# Patient Record
Sex: Female | Born: 1995 | Race: White | Hispanic: No | Marital: Married | Smoking: Never smoker
Health system: Southern US, Community
[De-identification: ages and names within clinical notes are randomized; demographics above are authoritative.]

## PROBLEM LIST (undated history)

## (undated) DIAGNOSIS — F988 Other specified behavioral and emotional disorders with onset usually occurring in childhood and adolescence: Secondary | ICD-10-CM

## (undated) DIAGNOSIS — O24419 Gestational diabetes mellitus in pregnancy, unspecified control: Secondary | ICD-10-CM

## (undated) DIAGNOSIS — Z8639 Personal history of other endocrine, nutritional and metabolic disease: Secondary | ICD-10-CM

## (undated) DIAGNOSIS — K219 Gastro-esophageal reflux disease without esophagitis: Secondary | ICD-10-CM

## (undated) HISTORY — DX: Personal history of other endocrine, nutritional and metabolic disease: Z86.39

## (undated) HISTORY — DX: Gestational diabetes mellitus in pregnancy, unspecified control: O24.419

## (undated) HISTORY — DX: Other specified behavioral and emotional disorders with onset usually occurring in childhood and adolescence: F98.8

## (undated) HISTORY — PX: WISDOM TOOTH EXTRACTION: SHX21

---
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
## 2013-05-30 ENCOUNTER — Other Ambulatory Visit: Payer: Self-pay | Admitting: Emergency Medicine

## 2013-05-30 MED ORDER — ONDANSETRON HCL 4 MG PO TABS: 4.0000 mg | ORAL_TABLET | Freq: Three times a day (TID) | ORAL | Status: DC | PRN

## 2013-06-24 ENCOUNTER — Other Ambulatory Visit: Payer: Self-pay | Admitting: Internal Medicine

## 2013-06-24 DIAGNOSIS — A088 Other specified intestinal infections: Secondary | ICD-10-CM

## 2013-06-24 MED ORDER — ONDANSETRON 8 MG PO TBDP: ORAL_TABLET | ORAL | Status: DC

## 2013-06-24 MED ORDER — HYOSCYAMINE SULFATE 0.125 MG SL SUBL: SUBLINGUAL_TABLET | SUBLINGUAL | Status: DC

## 2013-07-09 ENCOUNTER — Other Ambulatory Visit: Payer: Self-pay | Admitting: Physician Assistant

## 2013-07-09 MED ORDER — DOXYCYCLINE HYCLATE 100 MG PO TABS: 100.0000 mg | ORAL_TABLET | Freq: Two times a day (BID) | ORAL | Status: DC

## 2013-07-13 ENCOUNTER — Other Ambulatory Visit: Payer: Self-pay | Admitting: Emergency Medicine

## 2013-07-13 MED ORDER — FLUCONAZOLE 150 MG PO TABS: 150.0000 mg | ORAL_TABLET | Freq: Every day | ORAL | Status: DC

## 2013-10-24 ENCOUNTER — Ambulatory Visit: Payer: BC Managed Care – PPO

## 2013-10-27 DIAGNOSIS — Z8639 Personal history of other endocrine, nutritional and metabolic disease: Secondary | ICD-10-CM | POA: Insufficient documentation

## 2013-10-29 ENCOUNTER — Other Ambulatory Visit: Payer: Self-pay | Admitting: Emergency Medicine

## 2013-10-29 ENCOUNTER — Encounter: Payer: Self-pay | Admitting: Emergency Medicine

## 2013-10-29 ENCOUNTER — Ambulatory Visit (INDEPENDENT_AMBULATORY_CARE_PROVIDER_SITE_OTHER): Payer: BC Managed Care – PPO | Admitting: Emergency Medicine

## 2013-10-29 VITALS — BP 118/70 | HR 86 | Temp 98.6°F | Resp 18 | Ht 63.25 in | Wt 147.0 lb

## 2013-10-29 DIAGNOSIS — R5381 Other malaise: Secondary | ICD-10-CM

## 2013-10-29 DIAGNOSIS — R109 Unspecified abdominal pain: Secondary | ICD-10-CM

## 2013-10-29 DIAGNOSIS — Z119 Encounter for screening for infectious and parasitic diseases, unspecified: Secondary | ICD-10-CM

## 2013-10-29 DIAGNOSIS — R5383 Other fatigue: Secondary | ICD-10-CM

## 2013-10-29 DIAGNOSIS — K5900 Constipation, unspecified: Secondary | ICD-10-CM

## 2013-10-29 DIAGNOSIS — R350 Frequency of micturition: Secondary | ICD-10-CM

## 2013-10-29 DIAGNOSIS — E559 Vitamin D deficiency, unspecified: Secondary | ICD-10-CM

## 2013-10-29 LAB — CBC WITH DIFFERENTIAL/PLATELET
Basophils Absolute: 0 10*3/uL (ref 0.0–0.1)
Basophils Relative: 0 % (ref 0–1)
Eosinophils Absolute: 0.1 10*3/uL (ref 0.0–0.7)
Eosinophils Relative: 1 % (ref 0–5)
HEMATOCRIT: 39.6 % (ref 36.0–46.0)
HEMOGLOBIN: 13.9 g/dL (ref 12.0–15.0)
LYMPHS ABS: 3 10*3/uL (ref 0.7–4.0)
LYMPHS PCT: 31 % (ref 12–46)
MCH: 30.7 pg (ref 26.0–34.0)
MCHC: 35.1 g/dL (ref 30.0–36.0)
MCV: 87.4 fL (ref 78.0–100.0)
MONO ABS: 0.7 10*3/uL (ref 0.1–1.0)
MONOS PCT: 7 % (ref 3–12)
NEUTROS ABS: 5.9 10*3/uL (ref 1.7–7.7)
NEUTROS PCT: 61 % (ref 43–77)
Platelets: 221 10*3/uL (ref 150–400)
RBC: 4.53 MIL/uL (ref 3.87–5.11)
RDW: 13.1 % (ref 11.5–15.5)
WBC: 9.7 10*3/uL (ref 4.0–10.5)

## 2013-10-29 MED ORDER — NORETHINDRONE 0.35 MG PO TABS: 1.0000 | ORAL_TABLET | Freq: Every day | ORAL | Status: DC

## 2013-10-29 NOTE — Progress Notes (Signed)
   Subjective:    Patient ID: Kelsey Wheeler, female    DOB: 01/06/1996, 18 y.o.   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 She is doing well overall except mild fatigue and mild lower abdomen pain. She has had low abdomen pain x 2 days. She notes cycles are normal. She does have mild constipation on /off. She did have BM with Restora and stool softener yesterday. She has mild increased urine frequency/ dysuria since starting new job and holding urine for long periods of time. She denies chance of pregnancy.     Medication List       This list is accurate as of: 10/29/13  4:09 PM.  Always use your most recent med list.               CALCIUM 600 PO  Take 1,200 mg by mouth daily.     Vitamin D-3 5000 UNITS Tabs  Take 30,000 Units by mouth daily.          Review of Systems  Constitutional: Positive for fatigue.  Gastrointestinal: Positive for abdominal pain and constipation.  Genitourinary: Positive for dysuria and frequency.  All other systems reviewed and are negative.  BP 118/70  Pulse 86  Temp(Src) 98.6 F (37 C) (Temporal)  Resp 18  Ht 5' 3.25" (1.607 m)  Wt 147 lb (66.679 kg)  BMI 25.82 kg/m2  LMP 10/06/2013     Objective:   Physical Exam  Nursing note and vitals reviewed. Constitutional: She is oriented to person, place, and time. She appears well-developed and well-nourished. No distress.  HENT:  Head: Normocephalic and atraumatic.  Right Ear: External ear normal.  Left Ear: External ear normal.  Nose: Nose normal.  Mouth/Throat: Oropharynx is clear and moist.  Eyes: Conjunctivae and EOM are normal. Pupils are equal, round, and reactive to light.  Neck: Normal range of motion. Neck supple. No JVD present. No thyromegaly present.  Cardiovascular: Normal rate, regular rhythm, normal heart sounds and intact distal pulses.   Pulmonary/Chest: Effort normal and breath sounds normal.  Abdominal: Soft. Bowel sounds are  normal. She exhibits no distension and no mass. There is tenderness. There is no rebound and no guarding.  Mild diffuse  Genitourinary:  Declines until insurance coverage improves  Musculoskeletal: Normal range of motion. She exhibits no edema and no tenderness.  Lymphadenopathy:    She has no cervical adenopathy.  Neurological: She is alert and oriented to person, place, and time. She has normal reflexes. No cranial nerve deficit.  Skin: Skin is warm and dry. No rash noted. No erythema. No pallor.  Psychiatric: She has a normal mood and affect. Her behavior is normal. Judgment and thought content normal.          Assessment & Plan:  1. Fatigue- check labs, increase activity and H2O. Requests minimal labs with insurance issues. W/c if symptoms increase  2. ? Constipation vs UTI vs abdomen pain with cyst?- Increase fiber/ water intake, decrease caffeine, increase activity level, can add Miralax or Metamucil QD until BM soft and probiotic QD. Call with results with restarting OCP or if increased symptoms. Declines labs.   3. School CPE- WNL, check Rubella titer per school protocol, vaccines reviewed and up to date. PPD NEG given at her new job.

## 2013-11-01 LAB — RUBELLA ANTIBODY, IGM: RUBELLA: 0.36 (ref <0.90)

## 2013-11-03 LAB — RUBELLA SCREEN: RUBELLA: 3.28 {index} — AB (ref <0.90)

## 2013-12-07 ENCOUNTER — Other Ambulatory Visit: Payer: Self-pay | Admitting: Internal Medicine

## 2013-12-07 MED ORDER — IVERMECTIN 3 MG PO TABS: 9.0000 mg | ORAL_TABLET | Freq: Once | ORAL | Status: AC

## 2013-12-19 ENCOUNTER — Ambulatory Visit: Payer: Self-pay | Admitting: Emergency Medicine

## 2013-12-19 ENCOUNTER — Ambulatory Visit (INDEPENDENT_AMBULATORY_CARE_PROVIDER_SITE_OTHER): Payer: BC Managed Care – PPO | Admitting: Emergency Medicine

## 2013-12-19 ENCOUNTER — Encounter: Payer: Self-pay | Admitting: Emergency Medicine

## 2013-12-19 VITALS — BP 114/64 | HR 112 | Temp 98.0°F | Resp 18 | Ht 63.5 in | Wt 146.0 lb

## 2013-12-19 DIAGNOSIS — J309 Allergic rhinitis, unspecified: Secondary | ICD-10-CM

## 2013-12-19 DIAGNOSIS — L03317 Cellulitis of buttock: Principal | ICD-10-CM

## 2013-12-19 DIAGNOSIS — L0231 Cutaneous abscess of buttock: Secondary | ICD-10-CM

## 2013-12-19 MED ORDER — DOXYCYCLINE HYCLATE 100 MG PO TABS
100.0000 mg | ORAL_TABLET | Freq: Two times a day (BID) | ORAL | Status: DC
Start: 2013-12-19 — End: 2014-02-12

## 2013-12-19 NOTE — Patient Instructions (Signed)
Abscess Warm epsom salt soaks An abscess (boil or furuncle) is an infected area on or under the skin. This area is filled with yellowish-white fluid (pus) and other material (debris). HOME CARE   Only take medicines as told by your doctor.  If you were given antibiotic medicine, take it as directed. Finish the medicine even if you start to feel better.  If gauze is used, follow your doctor's directions for changing the gauze.  To avoid spreading the infection:  Keep your abscess covered with a bandage.  Wash your hands well.  Do not share personal care items, towels, or whirlpools with others.  Avoid skin contact with others.  Keep your skin and clothes clean around the abscess.  Keep all doctor visits as told. GET HELP RIGHT AWAY IF:   You have more pain, puffiness (swelling), or redness in the wound site.  You have more fluid or blood coming from the wound site.  You have muscle aches, chills, or you feel sick.  You have a fever. MAKE SURE YOU:   Understand these instructions.  Will watch your condition.  Will get help right away if you are not doing well or get worse. Document Released: 11/17/2007 Document Revised: 11/30/2011 Document Reviewed: 08/13/2011 [facility] Patient Information 2015 [place], [place]. This information is not intended to replace advice given to you by your health care provider. Make sure you discuss any questions you have with your health care provider.  Sore Throat Warm salt water gargles daily. 1 tsp liquid benadryl + 1 tsp liquid Maalox, MIX/ GARGLE/ SPIT as needed for pain  A sore throat is a painful, burning, sore, or scratchy feeling of the throat. There may be pain or tenderness when swallowing or talking. You may have other symptoms with a sore throat. These include coughing, sneezing, fever, or a swollen neck. A sore throat is often the first sign of another sickness. These sicknesses may include a cold, flu, strep throat, or an infection  called mono. Most sore throats go away without medical treatment.  HOME CARE   Only take medicine as told by your doctor.  Drink enough fluids to keep your pee (urine) clear or pale yellow.  Rest as needed.  Try using throat sprays, lozenges, or suck on hard candy (if older than 4 years or as told).  Sip warm liquids, such as broth, herbal tea, or warm water with honey. Try sucking on frozen ice pops or drinking cold liquids.  Rinse the mouth (gargle) with salt water. Mix 1 teaspoon salt with 8 ounces of water.  Do not smoke. Avoid being around others when they are smoking.  Put a humidifier in your bedroom at night to moisten the air. You can also turn on a hot shower and sit in the bathroom for 5-10 minutes. Be sure the bathroom door is closed. GET HELP RIGHT AWAY IF:   You have trouble breathing.  You cannot swallow fluids, soft foods, or your spit (saliva).  You have more puffiness (swelling) in the throat.  Your sore throat does not get better in 7 days.  You feel sick to your stomach (nauseous) and throw up (vomit).  You have a fever or lasting symptoms for more than 2-3 days.  You have a fever and your symptoms suddenly get worse. MAKE SURE YOU:   Understand these instructions.  Will watch your condition.  Will get help right away if you are not doing well or get worse. Document Released: 03/09/2008 Document Revised: 02/23/2012  Document Reviewed: 02/06/2012 [facility] Patient Information 2015 [place], [place]. This information is not intended to replace advice given to you by your health care provider. Make sure you discuss any questions you have with your health care provider.

## 2013-12-19 NOTE — Progress Notes (Signed)
   Subjective:    Patient ID: Kelsey DickerRachel Ivey, female    DOB: 01/17/1996, 18 y.o.   MRN: [id number]  HPI Comments: 18 yo WF with ? Boil on bottom x 2 days with increased pain and redness x 1 day. She denies previous history of boils. She notes she had prolonged sitting over the weekend and was hot/ sweaty/ dirty.  She has had ST x 1 day and was exposed to several other sick people this weekend.  Sore Throat      Medication List       This list is accurate as of: 12/19/13  1:35 PM.  Always use your most recent med list.               CALCIUM 600 PO  Take 1,200 mg by mouth daily.     norethindrone 0.35 MG tablet  Commonly known as:  ORTHO MICRONOR  Take 1 tablet (0.35 mg total) by mouth daily.     Vitamin D-3 5000 UNITS Tabs  Take 30,000 Units by mouth daily.       No Known Allergies  Past Medical History  Diagnosis Date  . Hx of thyroiditis       Review of Systems  HENT: Positive for sore throat.   Skin: Positive for color change and wound.  All other systems reviewed and are negative.  BP 114/64  Pulse 112  Temp(Src) 98 F (36.7 C) (Temporal)  Resp 18  Ht 5' 3.5" (1.613 m)  Wt 146 lb (66.225 kg)  BMI 25.45 kg/m2  LMP 12/10/2013     Objective:   Physical Exam  Nursing note and vitals reviewed. Constitutional: She is oriented to person, place, and time. She appears well-developed and well-nourished.  HENT:  Head: Normocephalic and atraumatic.  Right Ear: External ear normal.  Left Ear: External ear normal.  Nose: Nose normal.  Mouth/Throat: Oropharynx is clear and moist.  Left tonsil with stone no erythema, Cloudy TM's bilaterally   Eyes: Conjunctivae and EOM are normal.  Neck: Normal range of motion.  Cardiovascular: Normal rate, regular rhythm, normal heart sounds and intact distal pulses.   Pulmonary/Chest: Effort normal and breath sounds normal.  Musculoskeletal: Normal range of motion.  Lymphadenopathy:    She has no cervical adenopathy.    Neurological: She is alert and oriented to person, place, and time.  Skin: Skin is warm and dry.     Psychiatric: She has a normal mood and affect. Judgment normal.          Assessment & Plan:  1. Abscess- DOxy 100 mg AD, Epsom salt soaks, Hygiene discussed, f/u with results  2. Allergic rhinitis- Nasonex NS AD sx #1, increase H2o, allergy hygiene explained.

## 2013-12-21 ENCOUNTER — Encounter: Payer: Self-pay | Admitting: Emergency Medicine

## 2013-12-21 ENCOUNTER — Ambulatory Visit (INDEPENDENT_AMBULATORY_CARE_PROVIDER_SITE_OTHER): Payer: BC Managed Care – PPO | Admitting: Emergency Medicine

## 2013-12-21 VITALS — BP 104/78 | HR 72 | Temp 98.0°F | Resp 18

## 2013-12-21 DIAGNOSIS — L0231 Cutaneous abscess of buttock: Secondary | ICD-10-CM

## 2013-12-21 DIAGNOSIS — L03317 Cellulitis of buttock: Principal | ICD-10-CM

## 2013-12-21 NOTE — Progress Notes (Signed)
   Subjective:    Patient ID: Kelsey Wheeler, female    DOB: 04/13/1996, 18 y.o.   MRN: [id number]  HPI Comments: 18 yo WF for recheck of left buttock abscess. She has been on doxycycline AD and notes new head formation on area of concern. She has been soaking in warm water.     Review of Systems  Skin: Positive for wound.  All other systems reviewed and are negative.  BP 104/78  Pulse 72  Temp(Src) 98 F (36.7 C) (Temporal)  Resp 18  LMP 12/10/2013     Objective:   Physical Exam  Nursing note and vitals reviewed. Constitutional: She appears well-developed and well-nourished.  Musculoskeletal: Normal range of motion.  Neurological: She is alert.  Skin: Skin is warm and dry. There is erythema.     Psychiatric: Judgment normal.          Assessment & Plan:  Abscess left buttock- Verbal permission obtained. Area prepped with alcholol, #10 blade used to make small incision into area with blood/ yellow brown exudate expressed and sent for culture. Wound hygiene explained. Continue DOXY AD. Call with any concerns

## 2013-12-24 LAB — WOUND CULTURE
GRAM STAIN: NONE SEEN
GRAM STAIN: NONE SEEN
GRAM STAIN: NONE SEEN

## 2014-02-12 ENCOUNTER — Ambulatory Visit (INDEPENDENT_AMBULATORY_CARE_PROVIDER_SITE_OTHER): Payer: BC Managed Care – PPO | Admitting: Physician Assistant

## 2014-02-12 ENCOUNTER — Encounter: Payer: Self-pay | Admitting: Physician Assistant

## 2014-02-12 VITALS — BP 102/62 | HR 80 | Temp 97.1°F | Resp 16 | Ht 63.5 in | Wt 145.0 lb

## 2014-02-12 DIAGNOSIS — F411 Generalized anxiety disorder: Secondary | ICD-10-CM

## 2014-02-12 DIAGNOSIS — F988 Other specified behavioral and emotional disorders with onset usually occurring in childhood and adolescence: Secondary | ICD-10-CM

## 2014-02-12 MED ORDER — BUPROPION HCL ER (XL) 150 MG PO TB24: 150.0000 mg | ORAL_TABLET | ORAL | Status: DC

## 2014-02-12 NOTE — Progress Notes (Signed)
   Subjective:    Patient ID: Kelsey Wheeler, female    DOB: Jun 17, 1995, 18 y.o.   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 She states she has had increased worsening concentration in school, she reports a history since the 9th grade of getting trouble in school for talking, difficulty completing tasks.    Review of Systems  Constitutional: Negative.   HENT: Negative.   Respiratory: Negative.   Cardiovascular: Negative.   Gastrointestinal: Negative.   Genitourinary: Negative.   Musculoskeletal: Negative.   Neurological: Negative.   Psychiatric/Behavioral: Positive for decreased concentration. Negative for suicidal ideas, hallucinations, behavioral problems, confusion, sleep disturbance, self-injury, dysphoric mood and agitation. The patient is nervous/anxious. The patient is not hyperactive.        Objective:   Physical Exam  Constitutional: She is oriented to person, place, and time. She appears well-developed and well-nourished.  HENT:  Head: Normocephalic and atraumatic.  Eyes: Conjunctivae are normal. Pupils are equal, round, and reactive to light.  Neck: Normal range of motion. Neck supple.  Cardiovascular: Normal rate and regular rhythm.   Pulmonary/Chest: Effort normal and breath sounds normal.  Abdominal: Soft. Bowel sounds are normal.  Musculoskeletal: Normal range of motion.  Neurological: She is alert and oriented to person, place, and time.  Skin: Skin is warm and dry.       Assessment & Plan:  ADD- will try Wellbutrin 150XL for 1 month for anxiety/ADD , if it does not help we will try Adderall  BID.  Schedule a physical

## 2014-02-12 NOTE — Patient Instructions (Signed)

## 2014-02-26 ENCOUNTER — Encounter: Payer: Self-pay | Admitting: Emergency Medicine

## 2014-02-26 ENCOUNTER — Ambulatory Visit (INDEPENDENT_AMBULATORY_CARE_PROVIDER_SITE_OTHER): Payer: BC Managed Care – PPO | Admitting: Emergency Medicine

## 2014-02-26 VITALS — BP 112/68 | HR 92 | Temp 98.4°F | Resp 18 | Ht 64.0 in | Wt 149.0 lb

## 2014-02-26 DIAGNOSIS — Z0000 Encounter for general adult medical examination without abnormal findings: Secondary | ICD-10-CM

## 2014-02-26 DIAGNOSIS — F988 Other specified behavioral and emotional disorders with onset usually occurring in childhood and adolescence: Secondary | ICD-10-CM

## 2014-02-26 LAB — CBC WITH DIFFERENTIAL/PLATELET
Basophils Absolute: 0.1 10*3/uL (ref 0.0–0.1)
Basophils Relative: 1 % (ref 0–1)
Eosinophils Absolute: 0.1 10*3/uL (ref 0.0–0.7)
Eosinophils Relative: 1 % (ref 0–5)
HCT: 42 % (ref 36.0–46.0)
HEMOGLOBIN: 14.9 g/dL (ref 12.0–15.0)
LYMPHS ABS: 2.8 10*3/uL (ref 0.7–4.0)
LYMPHS PCT: 25 % (ref 12–46)
MCH: 31.3 pg (ref 26.0–34.0)
MCHC: 35.5 g/dL (ref 30.0–36.0)
MCV: 88.2 fL (ref 78.0–100.0)
MONOS PCT: 6 % (ref 3–12)
Monocytes Absolute: 0.7 10*3/uL (ref 0.1–1.0)
NEUTROS PCT: 67 % (ref 43–77)
Neutro Abs: 7.6 10*3/uL (ref 1.7–7.7)
PLATELETS: 238 10*3/uL (ref 150–400)
RBC: 4.76 MIL/uL (ref 3.87–5.11)
RDW: 13.1 % (ref 11.5–15.5)
WBC: 11.3 10*3/uL — AB (ref 4.0–10.5)

## 2014-02-26 MED ORDER — AMPHETAMINE-DEXTROAMPHETAMINE 10 MG PO TABS: 10.0000 mg | ORAL_TABLET | Freq: Two times a day (BID) | ORAL | Status: DC

## 2014-02-26 NOTE — Progress Notes (Signed)
Subjective:    Patient ID: Kelsey Wheeler, female    DOB: 08-15-1995, 18 y.o.   MRN: [id number]  HPI Comments: 18 yo WF CPE. She notes increased difficulty with school and has tried Wellbutrin with out relief of symptoms. She also noted increased fatigue and dizziness with Wellbutrin. She notes she has to read homework multiple times and still has difficulty with comprehension. She has failed Psych 2 times in the last year. She has had difficulty keeping focused and finishing tasks.  She notes her cycles are more regular. She has been eating healthy. She is not working out routinely. She does want to consider starting birth control in the future with plans to get married in the next 2 years. She has tried multiple OCP in past but discontinued with mood changes or weight gain.    WBC      9.7   10/29/2013 HGB     13.9   10/29/2013 HCT     39.6   10/29/2013 PLT      221   10/29/2013    Current Outpatient Prescriptions on File Prior to Visit  Medication Sig Dispense Refill  . Calcium Carbonate (CALCIUM 600 PO) Take 1,200 mg by mouth daily.      . Cholecalciferol (VITAMIN D-3) 5000 UNITS TABS Take 30,000 Units by mouth daily.      . norethindrone (ORTHO MICRONOR) 0.35 MG tablet Take 1 tablet (0.35 mg total) by mouth daily.  1 Package  11   No current facility-administered medications on file prior to visit.   No Known Allergies Past Medical History  Diagnosis Date  . Hx of thyroiditis    No past surgical history on file.  History  Substance Use Topics  . Smoking status: Never Smoker   . Smokeless tobacco: Not on file  . Alcohol Use: Not on file   Family History  Problem Relation Age of Onset  . Hyperlipidemia Mother   . Hyperlipidemia Father   . Hyperlipidemia Maternal Grandmother   . Hypertension Maternal Grandmother   . Cancer Maternal Grandfather     lung  . Cancer Paternal Grandfather     lung   MAINTENANCE: Pap/ Pelvic:05/2008 with U/S WNL EYE: Dentist:q 6  month  IMMUNIZATIONS: Tdap:2013 Influenza:2014  Patient Care Team: Lucky Cowboy, MD as PCP - General (Internal Medicine)   Review of Systems  Constitutional: Negative for fatigue.  Respiratory: Negative for chest tightness.   Cardiovascular: Negative for chest pain.  Genitourinary: Negative for pelvic pain.  Psychiatric/Behavioral: Positive for decreased concentration. Negative for suicidal ideas, behavioral problems and sleep disturbance.  All other systems reviewed and are negative.  BP 112/68  Pulse 92  Temp(Src) 98.4 F (36.9 C) (Temporal)  Resp 18  Ht  (1.626 m)  Wt 149 lb (67.586 kg)  BMI 25.56 kg/m2  LMP 02/13/2014     Objective:   Physical Exam  Nursing note and vitals reviewed. Constitutional: She is oriented to person, place, and time. She appears well-developed and well-nourished. No distress.  HENT:  Head: Normocephalic and atraumatic.  Right Ear: External ear normal.  Left Ear: External ear normal.  Nose: Nose normal.  Mouth/Throat: Oropharynx is clear and moist.  1 + tonsils no erythema  Eyes: Conjunctivae and EOM are normal. Pupils are equal, round, and reactive to light. Right eye exhibits no discharge. Left eye exhibits no discharge. No scleral icterus.  Neck: Normal range of motion. Neck supple. No JVD present. No tracheal deviation present. No thyromegaly present.  Cardiovascular: Normal rate, regular rhythm, normal heart sounds and intact distal pulses.   Pulmonary/Chest: Effort normal and breath sounds normal.  Abdominal: Soft. Bowel sounds are normal. She exhibits no distension and no mass. There is no tenderness. There is no rebound and no guarding.  Genitourinary:  DEF 2016  Musculoskeletal: Normal range of motion. She exhibits no edema and no tenderness.  Lymphadenopathy:    She has no cervical adenopathy.  Neurological: She is alert and oriented to person, place, and time. She has normal reflexes. No cranial nerve deficit. She  exhibits normal muscle tone. Coordination normal.  Skin: Skin is warm and dry. No rash noted. No erythema. No pallor.  Psychiatric: She has a normal mood and affect. Her behavior is normal. Judgment and thought content normal.        Assessment & Plan:  1. CPE- Update screening labs/ History/ Immunizations/ Testing as needed. Advised healthy diet, QD exercise, increase H20 and continue RX/ Vitamins AD.  2. ADD- Adderall 10 mg advised try 1 daily x 1 week then if no change increase to 1 in early a.m. And 1/2 to one at lunch and call with results. Advised needs to try to establish good routines with school work/ sleep hygiene/ exercise

## 2014-02-26 NOTE — Patient Instructions (Signed)

## 2014-02-27 LAB — HEPATIC FUNCTION PANEL
ALBUMIN: 4.4 g/dL (ref 3.5–5.2)
ALT: 18 U/L (ref 0–35)
AST: 15 U/L (ref 0–37)
Alkaline Phosphatase: 66 U/L (ref 39–117)
BILIRUBIN INDIRECT: 0.2 mg/dL (ref 0.2–1.2)
Bilirubin, Direct: 0.1 mg/dL (ref 0.0–0.3)
TOTAL PROTEIN: 7.2 g/dL (ref 6.0–8.3)
Total Bilirubin: 0.3 mg/dL (ref 0.2–1.2)

## 2014-02-27 LAB — LIPID PANEL
Cholesterol: 132 mg/dL (ref 0–200)
HDL: 59 mg/dL (ref >39)
LDL CALC: 50 mg/dL (ref 0–99)
TRIGLYCERIDES: 115 mg/dL (ref <150)
Total CHOL/HDL Ratio: 2.2 Ratio
VLDL: 23 mg/dL (ref 0–40)

## 2014-02-27 LAB — URINALYSIS, ROUTINE W REFLEX MICROSCOPIC
Bilirubin Urine: NEGATIVE
Glucose, UA: NEGATIVE mg/dL
HGB URINE DIPSTICK: NEGATIVE
Ketones, ur: NEGATIVE mg/dL
LEUKOCYTES UA: NEGATIVE
NITRITE: NEGATIVE
PROTEIN: NEGATIVE mg/dL
Specific Gravity, Urine: 1.007 (ref 1.005–1.030)
UROBILINOGEN UA: 0.2 mg/dL (ref 0.0–1.0)
pH: 6 (ref 5.0–8.0)

## 2014-02-27 LAB — VITAMIN B12: Vitamin B-12: 347 pg/mL (ref 211–911)

## 2014-02-27 LAB — FOLATE RBC: RBC Folate: 496 ng/mL (ref >280)

## 2014-02-27 LAB — TSH: TSH: 1.077 u[IU]/mL (ref 0.350–4.500)

## 2014-02-27 LAB — BASIC METABOLIC PANEL WITH GFR
BUN: 13 mg/dL (ref 6–23)
CALCIUM: 9.4 mg/dL (ref 8.4–10.5)
CHLORIDE: 101 meq/L (ref 96–112)
CO2: 29 meq/L (ref 19–32)
CREATININE: 0.97 mg/dL (ref 0.50–1.10)
GFR, Est African American: >89 mL/min
GFR, Est Non African American: 84 mL/min
GLUCOSE: 73 mg/dL (ref 70–99)
Potassium: 4.2 mEq/L (ref 3.5–5.3)
Sodium: 139 mEq/L (ref 135–145)

## 2014-02-27 LAB — IRON AND TIBC
%SAT: 29 % (ref 20–55)
Iron: 81 ug/dL (ref 42–145)
TIBC: 281 ug/dL (ref 250–470)
UIBC: 200 ug/dL (ref 125–400)

## 2014-02-27 LAB — VITAMIN D 25 HYDROXY (VIT D DEFICIENCY, FRACTURES): VIT D 25 HYDROXY: 35 ng/mL (ref 30–89)

## 2014-02-27 LAB — INSULIN, FASTING: INSULIN FASTING, SERUM: 16.1 u[IU]/mL (ref 2.0–19.6)

## 2014-02-27 LAB — HEMOGLOBIN A1C
HEMOGLOBIN A1C: 5.1 % (ref <5.7)
Mean Plasma Glucose: 100 mg/dL (ref <117)

## 2014-02-27 LAB — MAGNESIUM: MAGNESIUM: 2.1 mg/dL (ref 1.5–2.5)

## 2014-03-03 ENCOUNTER — Encounter: Payer: Self-pay | Admitting: Emergency Medicine

## 2014-03-03 DIAGNOSIS — F988 Other specified behavioral and emotional disorders with onset usually occurring in childhood and adolescence: Secondary | ICD-10-CM

## 2014-03-03 HISTORY — DX: Other specified behavioral and emotional disorders with onset usually occurring in childhood and adolescence: F98.8

## 2014-03-12 ENCOUNTER — Ambulatory Visit (INDEPENDENT_AMBULATORY_CARE_PROVIDER_SITE_OTHER): Payer: BC Managed Care – PPO | Admitting: *Deleted

## 2014-03-12 DIAGNOSIS — Z23 Encounter for immunization: Secondary | ICD-10-CM

## 2014-05-06 ENCOUNTER — Telehealth: Payer: Self-pay | Admitting: Physician Assistant

## 2014-05-06 DIAGNOSIS — F988 Other specified behavioral and emotional disorders with onset usually occurring in childhood and adolescence: Secondary | ICD-10-CM

## 2014-05-06 MED ORDER — AMPHETAMINE-DEXTROAMPHETAMINE 10 MG PO TABS: 10.0000 mg | ORAL_TABLET | Freq: Two times a day (BID) | ORAL | Status: DC

## 2014-05-06 NOTE — Telephone Encounter (Signed)
Patient's Mother Kelsey Wheeler works here and she is going to give pt prescription.

## 2014-05-06 NOTE — Telephone Encounter (Signed)
Patient needs refill of Adderall- last filled 02/26/14.  She only takes 1 and 1/2 tablets per day.  Last visit 02/26/14.  Next visit 02/27/15 for physical.  Cullen Lahaie, Lise AuerJennifer L, PA-C 8:11 AM [facility]

## 2014-09-13 ENCOUNTER — Encounter (INDEPENDENT_AMBULATORY_CARE_PROVIDER_SITE_OTHER): Payer: Self-pay

## 2014-09-13 ENCOUNTER — Other Ambulatory Visit: Payer: Self-pay | Admitting: Internal Medicine

## 2014-09-13 ENCOUNTER — Other Ambulatory Visit: Payer: Commercial Indemnity

## 2014-09-13 DIAGNOSIS — Z8614 Personal history of Methicillin resistant Staphylococcus aureus infection: Secondary | ICD-10-CM

## 2014-09-16 LAB — MRSA CULTURE

## 2014-09-19 ENCOUNTER — Encounter: Payer: Self-pay | Admitting: Emergency Medicine

## 2014-10-07 ENCOUNTER — Other Ambulatory Visit: Payer: Self-pay | Admitting: Internal Medicine

## 2014-10-07 DIAGNOSIS — F988 Other specified behavioral and emotional disorders with onset usually occurring in childhood and adolescence: Secondary | ICD-10-CM

## 2014-10-07 MED ORDER — AMPHETAMINE-DEXTROAMPHETAMINE 10 MG PO TABS: 10.0000 mg | ORAL_TABLET | Freq: Two times a day (BID) | ORAL | Status: DC

## 2014-10-23 ENCOUNTER — Encounter: Payer: Self-pay | Admitting: Emergency Medicine

## 2014-10-23 ENCOUNTER — Ambulatory Visit (INDEPENDENT_AMBULATORY_CARE_PROVIDER_SITE_OTHER): Payer: Commercial Indemnity | Admitting: Emergency Medicine

## 2014-10-23 VITALS — BP 100/62 | HR 74 | Temp 98.6°F | Resp 18 | Ht 64.0 in | Wt 146.0 lb

## 2014-10-23 DIAGNOSIS — N926 Irregular menstruation, unspecified: Secondary | ICD-10-CM

## 2014-10-23 DIAGNOSIS — F909 Attention-deficit hyperactivity disorder, unspecified type: Secondary | ICD-10-CM

## 2014-10-23 DIAGNOSIS — F988 Other specified behavioral and emotional disorders with onset usually occurring in childhood and adolescence: Secondary | ICD-10-CM

## 2014-10-23 MED ORDER — NORETHIN ACE-ETH ESTRAD-FE 1-20 MG-MCG(24) PO CHEW: CHEWABLE_TABLET | ORAL | Status: DC

## 2014-10-23 NOTE — Progress Notes (Signed)
Subjective:    Patient ID: Kelsey Wheeler, female    DOB: [birth year], 18 y.o.   MRN: [id number]  HPI Comments: 19 yo WF f/u for ADD and OCP concerns. She takes adderall when needed and denies any SE. She uses for focus with school.   She is still having irregular periods but more consistent than in the past. She had tried [place] which made her irritable/ gain weight. Nuva ring was not tolerated. She still has cramps with each cycle controlled with OTC Tylenol. LMP current. She wants to retry oral OCP.   She has not been taking Vitamins AD.    Lab Results      Component                Value               Date                      WBC                      11.3*               02/26/2014                HGB                      14.9                02/26/2014                HCT                      42.0                02/26/2014                PLT                      238                 02/26/2014                GLUCOSE                  73                  02/26/2014                CHOL                     132                 02/26/2014                TRIG                     115                 02/26/2014                HDL                      59                  02/26/2014  LDLCALC                  50                  02/26/2014                ALT                      18                  02/26/2014                AST                      15                  02/26/2014                NA                       139                 02/26/2014                K                        4.2                 02/26/2014                CL                       101                 02/26/2014                CREATININE               0.97                02/26/2014                BUN                      13                  02/26/2014                CO2                      29                  02/26/2014                TSH                      1.077               02/26/2014                HGBA1C                    5.1                 02/26/2014                Medication List  This list is accurate as of: 10/23/14  2:03 PM.  Always use your most recent med list.               amphetamine-dextroamphetamine 10 MG tablet  Commonly known as:  ADDERALL  Take 1 tablet (10 mg total) by mouth 2 (two) times daily with a meal.       No Known Allergies  Past Medical History  Diagnosis Date  . Hx of thyroiditis   . ADD (attention deficit disorder) 03/03/2014     Review of Systems  Constitutional: Negative for fatigue.  Respiratory: Negative for shortness of breath.   Gastrointestinal: Negative for abdominal pain.  Genitourinary: Positive for menstrual problem. Negative for pelvic pain.  All other systems reviewed and are negative.  BP 100/62 mmHg  Pulse 74  Temp(Src) 98.6 F (37 C) (Temporal)  Resp 18  Ht 5\' 4"  (1.626 m)  Wt 146 lb (66.225 kg)  BMI 25.05 kg/m2  LMP 10/23/2014     Objective:   Physical Exam  Constitutional: She is oriented to person, place, and time. She appears well-developed and well-nourished. No distress.  HENT:  Head: Normocephalic.  Eyes: Conjunctivae and EOM are normal.  Neck: Normal range of motion. Neck supple. No thyromegaly present.  Cardiovascular: Normal rate, regular rhythm, normal heart sounds and intact distal pulses.   Pulmonary/Chest: Effort normal and breath sounds normal.  Abdominal: Soft. Bowel sounds are normal. There is no tenderness.  Musculoskeletal: Normal range of motion.  Lymphadenopathy:    She has no cervical adenopathy.  Neurological: She is alert and oriented to person, place, and time. No cranial nerve deficit.  Skin: Skin is warm and dry. No rash noted.  Psychiatric: She has a normal mood and affect. Judgment normal.  Nursing note and vitals reviewed.       Assessment & Plan:  1. ADD- Controlled continue AD.   2. Abnormal menses- Trial of new OCP. Advised of risks of complications with PE/ DVT-  CLOTs and will go to ER with any concerns. Advised of OCP hygiene. Call with any concerns  3. VIT D and B 12 low normals- advised 5000 IU D and Super B OTC AD.

## 2014-11-15 ENCOUNTER — Ambulatory Visit (INDEPENDENT_AMBULATORY_CARE_PROVIDER_SITE_OTHER): Payer: Commercial Indemnity | Admitting: *Deleted

## 2014-11-15 DIAGNOSIS — Z111 Encounter for screening for respiratory tuberculosis: Secondary | ICD-10-CM

## 2014-11-18 LAB — TB SKIN TEST
INDURATION: 0 mm
TB Skin Test: NEGATIVE

## 2015-01-02 ENCOUNTER — Other Ambulatory Visit: Payer: Self-pay

## 2015-01-02 DIAGNOSIS — N926 Irregular menstruation, unspecified: Secondary | ICD-10-CM

## 2015-01-02 MED ORDER — NORETHIN ACE-ETH ESTRAD-FE 1-20 MG-MCG(24) PO CHEW: CHEWABLE_TABLET | ORAL | Status: DC

## 2015-02-07 ENCOUNTER — Encounter: Payer: Self-pay | Admitting: Internal Medicine

## 2015-02-07 ENCOUNTER — Ambulatory Visit (INDEPENDENT_AMBULATORY_CARE_PROVIDER_SITE_OTHER): Payer: Commercial Indemnity | Admitting: Internal Medicine

## 2015-02-07 VITALS — BP 126/82 | HR 76 | Temp 97.5°F | Resp 16 | Ht 64.0 in | Wt 152.8 lb

## 2015-02-07 DIAGNOSIS — Z3009 Encounter for other general counseling and advice on contraception: Secondary | ICD-10-CM

## 2015-02-07 MED ORDER — NORETHINDRONE 0.35 MG PO TABS: 1.0000 | ORAL_TABLET | Freq: Every day | ORAL | Status: DC

## 2015-02-07 NOTE — Patient Instructions (Signed)
Norethindrone tablets (contraception) What is this medicine? NORETHINDRONE (nor eth IN drone) is an oral contraceptive. The product contains a female hormone known as a progestin. It is used to prevent pregnancy. This medicine may be used for other purposes; ask your health care provider or pharmacist if you have questions. COMMON BRAND NAME(S): Camila, Deblitane 28-Day, Errin, Heather, Jencycla, Jolivette, Lyza, Nor-QD, Nora-BE, Norlyroc, Ortho Micronor, Sharobel 28-Day What should I tell my health care provider before I take this medicine? They need to know if you have any of these conditions: -blood vessel disease or blood clots -breast, cervical, or vaginal cancer -diabetes -heart disease -kidney disease -liver disease -mental depression -migraine -seizures -stroke -vaginal bleeding -an unusual or allergic reaction to norethindrone, other medicines, foods, dyes, or preservatives -pregnant or trying to get pregnant -breast-feeding How should I use this medicine? Take this medicine by mouth with a glass of water. You may take it with or without food. Follow the directions on the prescription label. Take this medicine at the same time each day and in the order directed on the package. Do not take your medicine more often than directed. Contact your pediatrician regarding the use of this medicine in children. Special care may be needed. This medicine has been used in female children who have started having menstrual periods. A patient package insert for the product will be given with each prescription and refill. Read this sheet carefully each time. The sheet may change frequently. Overdosage: If you think you have taken too much of this medicine contact a poison control center or emergency room at once. NOTE: This medicine is only for you. Do not share this medicine with others. What if I miss a dose? Try not to miss a dose. Every time you miss a dose or take a dose late your chance of  pregnancy increases. When 1 pill is missed (even if only 3 hours late), take the missed pill as soon as possible and continue taking a pill each day at the regular time (use a back up method of birth control for the next 48 hours). If more than 1 dose is missed, use an additional birth control method for the rest of your pill pack until menses occurs. Contact your health care professional if more than 1 dose has been missed. What may interact with this medicine? Do not take this medicine with any of the following medications: -amprenavir or fosamprenavir -bosentan This medicine may also interact with the following medications: -antibiotics or medicines for infections, especially rifampin, rifabutin, rifapentine, and griseofulvin, and possibly penicillins or tetracyclines -aprepitant -barbiturate medicines, such as phenobarbital -carbamazepine -felbamate -modafinil -oxcarbazepine -phenytoin -ritonavir or other medicines for HIV infection or AIDS -St. John's wort -topiramate This list may not describe all possible interactions. Give your health care provider a list of all the medicines, herbs, non-prescription drugs, or dietary supplements you use. Also tell them if you smoke, drink alcohol, or use illegal drugs. Some items may interact with your medicine. What should I watch for while using this medicine? Visit your doctor or health care professional for regular checks on your progress. You will need a regular breast and pelvic exam and Pap smear while on this medicine. Use an additional method of birth control during the first cycle that you take these tablets. If you have any reason to think you are pregnant, stop taking this medicine right away and contact your doctor or health care professional. If you are taking this medicine for hormone related problems, it   may take several cycles of use to see improvement in your condition. This medicine does not protect you against HIV infection (AIDS)  or any other sexually transmitted diseases. What side effects may I notice from receiving this medicine? Side effects that you should report to your doctor or health care professional as soon as possible: -breast tenderness or discharge -pain in the abdomen, chest, groin or leg -severe headache -skin rash, itching, or hives -sudden shortness of breath -unusually weak or tired -vision or speech problems -yellowing of skin or eyes Side effects that usually do not require medical attention (report to your doctor or health care professional if they continue or are bothersome): -changes in sexual desire -change in menstrual flow -facial hair growth -fluid retention and swelling -headache -irritability -nausea -weight gain or loss This list may not describe all possible side effects. Call your doctor for medical advice about side effects. You may report side effects to FDA at 1-800-FDA-1088. Where should I keep my medicine? Keep out of the reach of children. Store at room temperature between 15 and 30 degrees C (59 and 86 degrees F). Throw away any unused medicine after the expiration date. NOTE: This sheet is a summary. It may not cover all possible information. If you have questions about this medicine, talk to your doctor, pharmacist, or health care provider.  2015, Elsevier/Gold Standard. (2012-02-18 16:41:35)  

## 2015-02-07 NOTE — Progress Notes (Signed)
   Subjective:    Patient ID: Kelsey Wheeler, female    DOB: 1995-10-01, 19 y.o.   MRN: [id number]  HPI  Patient presents to the office for evaluation of contraception change.  Sh eis currently on Minastrin FE and she finds that she is gaining a lot of weight on it.  She does not feel any other side effects.  She reports that she is taking her pill daily.  She would like to try the minipill instead as she does not like the estrogen component.    Review of Systems  Constitutional: Positive for unexpected weight change.  Genitourinary: Negative for dysuria, urgency, frequency, hematuria, vaginal bleeding, vaginal discharge, difficulty urinating and vaginal pain.       Objective:   Physical Exam  Constitutional: She is oriented to person, place, and time. She appears well-developed and well-nourished. No distress.  HENT:  Head: Normocephalic.  Mouth/Throat: Oropharynx is clear and moist. No oropharyngeal exudate.  Eyes: Conjunctivae are normal. No scleral icterus.  Neck: Normal range of motion. Neck supple. No JVD present. No thyromegaly present.  Cardiovascular: Normal rate, regular rhythm, normal heart sounds and intact distal pulses.  Exam reveals no gallop and no friction rub.   No murmur heard. Pulmonary/Chest: Effort normal and breath sounds normal. No respiratory distress. She has no wheezes. She has no rales. She exhibits no tenderness.  Abdominal: Soft. Bowel sounds are normal. She exhibits no distension and no mass. There is no tenderness. There is no rebound and no guarding.  Musculoskeletal: Normal range of motion.  Lymphadenopathy:    She has no cervical adenopathy.  Neurological: She is alert and oriented to person, place, and time.  Skin: Skin is warm and dry. She is not diaphoretic.  Psychiatric: She has a normal mood and affect. Her behavior is normal. Judgment and thought content normal.  Nursing note and vitals reviewed.   Filed Vitals:   02/07/15 0844  BP: 126/82   Pulse: 76  Temp: 97.5 F (36.4 C)  Resp: 16          Assessment & Plan:    1. Encounter for other general counseling or advice on contraception -recommended backup method x 1 month -recommended taking at the same time daily -advised that the medication does not prevent STDs - norethindrone (ORTHO MICRONOR) 0.35 MG tablet; Take 1 tablet (0.35 mg total) by mouth daily.  Dispense: 1 Package; Refill: 11

## 2015-02-18 ENCOUNTER — Other Ambulatory Visit: Payer: Self-pay | Admitting: Internal Medicine

## 2015-02-18 DIAGNOSIS — F988 Other specified behavioral and emotional disorders with onset usually occurring in childhood and adolescence: Secondary | ICD-10-CM

## 2015-02-18 MED ORDER — AMPHETAMINE-DEXTROAMPHETAMINE 10 MG PO TABS: 10.0000 mg | ORAL_TABLET | Freq: Two times a day (BID) | ORAL | Status: DC

## 2015-02-27 ENCOUNTER — Encounter: Payer: Self-pay | Admitting: Emergency Medicine

## 2015-03-18 ENCOUNTER — Encounter: Payer: Self-pay | Admitting: Internal Medicine

## 2015-03-18 ENCOUNTER — Ambulatory Visit (INDEPENDENT_AMBULATORY_CARE_PROVIDER_SITE_OTHER): Payer: Commercial Indemnity | Admitting: Internal Medicine

## 2015-03-18 VITALS — BP 112/78 | HR 102 | Temp 98.2°F | Resp 18 | Ht 64.0 in | Wt 152.0 lb

## 2015-03-18 DIAGNOSIS — Z8639 Personal history of other endocrine, nutritional and metabolic disease: Secondary | ICD-10-CM | POA: Diagnosis not present

## 2015-03-18 LAB — TSH: TSH: 1.125 u[IU]/mL (ref 0.350–4.500)

## 2015-03-18 MED ORDER — PREDNISONE 20 MG PO TABS: ORAL_TABLET | ORAL | Status: DC

## 2015-03-18 NOTE — Progress Notes (Signed)
   Subjective:    Patient ID: Kelsey Wheeler, female    DOB: May 08, 1996, 19 y.o.   MRN: [id number]  HPI  Patient presents to the office for evaluation of lump underneath chin x 4 days.  Patient reports no injury that she can think of.  She reports that initially it was really painful and felt like a bruise and then has gradually gone away.  The lump is still there but much smaller.  She has never had anything like it.  She did not try anything for it at home.  She reports that it has gotten better on its own.  No problems with dry mouth, cough, congestion.  She has had some more rhinorrhea and sorethroat.  She does not take anything for it.  She does normally have bad seasonal allergies.    She also reports that she would like to have her thyroid checked as she feels like her neck is a little bit swollen.     Review of Systems  Constitutional: Negative for fever, chills and fatigue.  HENT: Positive for congestion, postnasal drip, rhinorrhea and sore throat. Negative for ear pain, sinus pressure, tinnitus and voice change.   Respiratory: Negative for cough, chest tightness and shortness of breath.   Cardiovascular: Negative for chest pain and palpitations.       Objective:   Physical Exam  Constitutional: She is oriented to person, place, and time. She appears well-developed and well-nourished. No distress.  HENT:  Head: Normocephalic.  Mouth/Throat: Oropharynx is clear and moist. No oropharyngeal exudate.  Palpable submental lymph node  Eyes: Conjunctivae are normal. No scleral icterus.  Neck: Normal range of motion. Neck supple. No tracheal deviation present. No thyromegaly present.  Cardiovascular: Normal rate, regular rhythm, normal heart sounds and intact distal pulses.  Exam reveals no gallop and no friction rub.   No murmur heard. Pulmonary/Chest: Effort normal and breath sounds normal. No respiratory distress. She has no wheezes. She has no rales. She exhibits no tenderness.   Abdominal: Soft. Bowel sounds are normal. She exhibits no distension and no mass. There is no tenderness. There is no rebound and no guarding.  Musculoskeletal: Normal range of motion.  Lymphadenopathy:    She has no cervical adenopathy.  Neurological: She is alert and oriented to person, place, and time.  Skin: Skin is warm and dry. She is not diaphoretic.  Psychiatric: She has a normal mood and affect. Her behavior is normal. Judgment and thought content normal.  Nursing note and vitals reviewed.   Filed Vitals:   03/18/15 0907  BP: 112/78  Pulse: 102  Temp: 98.2 F (36.8 C)  Resp: 18          Assessment & Plan:    1. Hx of thyroiditis  - TSH -exam unimpressive  Swelling in neck likely due to lymph node reaction.  Will try prednisone as this is probably secondary to allergies.

## 2015-06-23 ENCOUNTER — Other Ambulatory Visit: Payer: Self-pay | Admitting: Internal Medicine

## 2015-06-23 DIAGNOSIS — J208 Acute bronchitis due to other specified organisms: Secondary | ICD-10-CM

## 2015-06-23 MED ORDER — AZITHROMYCIN 250 MG PO TABS: ORAL_TABLET | ORAL | Status: DC

## 2015-07-22 ENCOUNTER — Encounter: Payer: Self-pay | Admitting: Internal Medicine

## 2015-07-22 ENCOUNTER — Ambulatory Visit (INDEPENDENT_AMBULATORY_CARE_PROVIDER_SITE_OTHER): Payer: Commercial Indemnity | Admitting: Internal Medicine

## 2015-07-22 VITALS — BP 116/74 | HR 108 | Temp 98.2°F | Resp 16 | Ht 64.0 in | Wt 151.0 lb

## 2015-07-22 DIAGNOSIS — J09X2 Influenza due to identified novel influenza A virus with other respiratory manifestations: Secondary | ICD-10-CM

## 2015-07-22 LAB — POCT INFLUENZA A/B
INFLUENZA A, POC: POSITIVE — AB
Influenza B, POC: NEGATIVE

## 2015-07-22 MED ORDER — PHENYLEPH-PROMETHAZINE-COD 5-6.25-10 MG/5ML PO SYRP: ORAL_SOLUTION | ORAL | Status: DC

## 2015-07-22 MED ORDER — PREDNISONE 20 MG PO TABS: ORAL_TABLET | ORAL | Status: DC

## 2015-07-22 MED ORDER — OSELTAMIVIR PHOSPHATE 75 MG PO CAPS: 75.0000 mg | ORAL_CAPSULE | Freq: Two times a day (BID) | ORAL | Status: AC

## 2015-07-22 NOTE — Patient Instructions (Signed)
Oseltamivir capsules What is this medicine? OSELTAMIVIR (os el TAM i vir) is an antiviral medicine. It is used to prevent and to treat some kinds of influenza or the flu. It will not work for colds or other viral infections. This medicine may be used for other purposes; ask your health care provider or pharmacist if you have questions. What should I tell my health care provider before I take this medicine? They need to know if you have any of the following conditions: -heart disease -immune system problems -kidney disease -liver disease -lung disease -an unusual or allergic reaction to oseltamivir, other medicines, foods, dyes, or preservatives -pregnant or trying to get pregnant -breast-feeding How should I use this medicine? Take this medicine by mouth with a glass of water. Follow the directions on the prescription label. Start this medicine at the first sign of flu symptoms. You can take it with or without food. If it upsets your stomach, take it with food. Take your medicine at regular intervals. Do not take your medicine more often than directed. Take all of your medicine as directed even if you think you are better. Do not skip doses or stop your medicine early. Talk to your pediatrician regarding the use of this medicine in children. While this drug may be prescribed for children as young as 14 days for selected conditions, precautions do apply. Overdosage: If you think you have taken too much of this medicine contact a poison control center or emergency room at once. NOTE: This medicine is only for you. Do not share this medicine with others. What if I miss a dose? If you miss a dose, take it as soon as you remember. If it is almost time for your next dose (within 2 hours), take only that dose. Do not take double or extra doses. What may interact with this medicine? Interactions are not expected. This list may not describe all possible interactions. Give your health care provider a  list of all the medicines, herbs, non-prescription drugs, or dietary supplements you use. Also tell them if you smoke, drink alcohol, or use illegal drugs. Some items may interact with your medicine. What should I watch for while using this medicine? Visit your doctor or health care professional for regular check ups. Tell your doctor if your symptoms do not start to get better or if they get worse. If you have the flu, you may be at an increased risk of developing seizures, confusion, or abnormal behavior. This occurs early in the illness, and more frequently in children and teens. These events are not common, but may result in accidental injury to the patient. Families and caregivers of patients should watch for signs of unusual behavior and contact a doctor or health care professional right away if the patient shows signs of unusual behavior. This medicine is not a substitute for the flu shot. Talk to your doctor each year about an annual flu shot. What side effects may I notice from receiving this medicine? Side effects that you should report to your doctor or health care professional as soon as possible: -allergic reactions like skin rash, itching or hives, swelling of the face, lips, or tongue -anxiety, confusion, unusual behavior -breathing problems -hallucination, loss of contact with reality -redness, blistering, peeling or loosening of the skin, including inside the mouth -seizures Side effects that usually do not require medical attention (report to your doctor or health care professional if they continue or are bothersome): -diarrhea -headache -nausea, vomiting -pain This list   may not describe all possible side effects. Call your doctor for medical advice about side effects. You may report side effects to FDA at 1-800-FDA-1088. Where should I keep my medicine? Keep out of the reach of children. Store at room temperature between 15 and 30 degrees C (59 and 86 degrees F). Throw away  any unused medicine after the expiration date. NOTE: This sheet is a summary. It may not cover all possible information. If you have questions about this medicine, talk to your doctor, pharmacist, or health care provider.    2016, Elsevier/Gold Standard. (2014-12-04 10:50:39)   

## 2015-07-22 NOTE — Progress Notes (Signed)
Patient ID: Kelsey Wheeler, female   DOB: 05/20/1996, 20 y.o.   MRN: [id number]  HPI  Patient presents to the office for evaluation of cough.  It has been going on for 1 days.  Patient reports all the time, wet.  They also endorse change in voice, chills, fever, postnasal drip and nasal congestion, sore throat, body aches.  .  They have tried tylenol.  They report that nothing has worked.  They denies other sick contacts.  She did have a flu shot.     Review of Systems  Constitutional: Positive for fever, chills and malaise/fatigue.  HENT: Positive for congestion and sore throat. Negative for ear pain.   Respiratory: Positive for cough. Negative for shortness of breath and wheezing.   Cardiovascular: Negative for chest pain, palpitations and leg swelling.  Neurological: Positive for headaches.    PE:  Filed Vitals:   07/22/15 0933  BP: 116/74  Pulse: 108  Temp: 98.2 F (36.8 C)  Resp: 16    General:  Alert and non-toxic, WDWN, NAD HEENT: NCAT, PERLA, EOM normal, no occular discharge or erythema.  Nasal mucosal edema with sinus tenderness to palpation.  Oropharynx clear with minimal oropharyngeal edema and erythema.  Mucous membranes moist and pink. Neck:  Cervical adenopathy Chest:  RRR no MRGs.  Lungs clear to auscultation A&P with no wheezes rhonchi or rales.   Abdomen: +BS x 4 quadrants, soft, non-tender, no guarding, rigidity, or rebound. Skin: warm and dry no rash Neuro: A&Ox4, CN II-XII grossly intact  Assessment and Plan:   1. Influenza due to identified novel influenza A virus with other respiratory manifestations -tamiflu per patient request -prednisone -phenergan codeine cough syrup -tylenol and ibuprofen prn -fluids -flonase -zyrtec

## 2015-07-22 NOTE — Addendum Note (Signed)
Addended by: Rayel Santizo A on: 07/22/2015 10:55 AM   Modules accepted: Orders

## 2015-11-05 ENCOUNTER — Ambulatory Visit (INDEPENDENT_AMBULATORY_CARE_PROVIDER_SITE_OTHER): Payer: PRIVATE HEALTH INSURANCE | Admitting: Internal Medicine

## 2015-11-05 ENCOUNTER — Encounter: Payer: Self-pay | Admitting: Internal Medicine

## 2015-11-05 VITALS — BP 118/74 | HR 90 | Temp 98.2°F | Resp 18 | Ht 64.0 in | Wt 153.0 lb

## 2015-11-05 DIAGNOSIS — Z111 Encounter for screening for respiratory tuberculosis: Secondary | ICD-10-CM | POA: Diagnosis not present

## 2015-11-05 DIAGNOSIS — Z789 Other specified health status: Secondary | ICD-10-CM

## 2015-11-05 DIAGNOSIS — Z9229 Personal history of other drug therapy: Secondary | ICD-10-CM

## 2015-11-05 NOTE — Progress Notes (Signed)
Annual Screening Comprehensive Examination   This very nice 20 y.o.female presents for return to school phsyical.  She is in good health with no complaints.  Patient has no major health issues.  Patient reports no complaints at this time.   She sees Dr. Sabra Heck for her eyes and does wear contact lenses.  Her vision is normal.  She does take her contacts out.  No vision complaints at this time.    Finally, patient has history of Vitamin D Deficiency and last vitamin D was  Lab Results  Component Value Date   VD25OH 35 02/26/2014  .  Currently on supplementation  She is taking birth control.  No physical complaints otherwise.  She is capable of squatting, bending, and lifting 50 lbs.  She is currently working.    She has recently had a ppd skin test for her job.  She will bring [place] documentation.     No current outpatient prescriptions on file prior to visit.   No current facility-administered medications on file prior to visit.    No Known Allergies  Past Medical History  Diagnosis Date  . Hx of thyroiditis   . ADD (attention deficit disorder) 03/03/2014    Immunization History  Administered Date(s) Administered  . HPV Quadrivalent 03/11/2008, 05/17/2008, 09/18/2008  . Hepatitis B 12/06/2011  . Influenza Split 03/29/2013, 03/12/2014  . PPD Test 11/15/2014, 11/05/2015  . Tdap 09/07/2011    No past surgical history on file.  Family History  Problem Relation Age of Onset  . Hyperlipidemia Mother   . Hyperlipidemia Father   . Hyperlipidemia Maternal Grandmother   . Hypertension Maternal Grandmother   . Cancer Maternal Grandfather     lung  . Cancer Paternal Grandfather     lung    Social History   Social History  . Marital Status: Single    Spouse Name: N/A  . Number of Children: N/A  . Years of Education: N/A   Occupational History  . Not on file.   Social History Main Topics  . Smoking status: Never Smoker   . Smokeless tobacco: Not on file  . Alcohol  Use: Not on file  . Drug Use: Not on file  . Sexual Activity: Not on file   Other Topics Concern  . Not on file   Social History Narrative   Review of Systems  Constitutional: Negative for fever, chills and malaise/fatigue.  HENT: Negative for congestion, ear pain and sore throat.   Respiratory: Negative for cough, shortness of breath and wheezing.   Cardiovascular: Negative for chest pain, palpitations and leg swelling.  Gastrointestinal: Negative for heartburn, abdominal pain, diarrhea, constipation, blood in stool and melena.  Genitourinary: Negative.   Skin: Negative.   Neurological: Negative for dizziness, sensory change, loss of consciousness and headaches.  Psychiatric/Behavioral: Negative for depression. The patient is not nervous/anxious and does not have insomnia.       Physical Exam  BP 118/74 mmHg  Pulse 90  Temp(Src) 98.2 F (36.8 C) (Temporal)  Resp 18  Ht '5\' 4"'$  (1.626 m)  Wt 153 lb (69.4 kg)  BMI 26.25 kg/m2  LMP 11/02/2015  General Appearance: Well nourished and in no apparent distress. Eyes: PERRLA, EOMs, conjunctiva no swelling or erythema, normal fundi and vessels. Sinuses: No frontal/maxillary tenderness ENT/Mouth: EACs patent / TMs  nl. Nares clear without erythema, swelling, mucoid exudates. Oral hygiene is good. No erythema, swelling, or exudate. Tongue normal, non-obstructing. Tonsils not swollen or erythematous. Hearing normal.  Neck:  Supple, thyroid normal. No bruits, nodes or JVD. Respiratory: Respiratory effort normal.  BS equal and clear bilateral without rales, rhonci, wheezing or stridor. Cardio: Heart sounds are normal with regular rate and rhythm and no murmurs, rubs or gallops. Peripheral pulses are normal and equal bilaterally without edema. No aortic or femoral bruits. Chest: symmetric with normal excursions and percussion. Abdomen: Flat, soft, with bowl sounds. Nontender, no guarding, rebound, hernias, masses, or organomegaly.   Lymphatics: Non tender without lymphadenopathy.  Musculoskeletal: Full ROM all peripheral extremities, joint stability, 5/5 strength, and normal gait. Skin: Warm and dry without rashes, lesions, cyanosis, clubbing or  ecchymosis.  Neuro: Cranial nerves intact, reflexes equal bilaterally. Normal muscle tone, no cerebellar symptoms. Sensation intact.  Pysch: Awake and oriented X 3, normal affect, Insight and Judgment appropriate.   Assessment and Plan   1. Screening examination for pulmonary tuberculosis  - PPD  2. History of varicella vaccination  - Varicella zoster antibody, IgG  3. History of MMR vaccination  - Rubella screen  Hearing testing was normal with [place] here in the office.    Vision 20/25 in bilateral eyes.  20/20 together.  Vaccinations are up to date.  She has had rubella titer in the past but we will renew this today.  We will also get varicella vaccinations.      Continue prudent diet as discussed, weight control, regular exercise, and medications. Routine screening labs and tests as requested with regular follow-up as recommended.  Over 40 minutes of exam, counseling, chart review and critical decision making was performed

## 2015-11-06 LAB — VARICELLA ZOSTER ANTIBODY, IGG: Varicella IgG: 1914 Index — ABNORMAL HIGH (ref <135.00)

## 2015-11-06 LAB — RUBELLA SCREEN: Rubella: 3.86 Index — ABNORMAL HIGH (ref <0.90)

## 2015-11-07 LAB — TB SKIN TEST
INDURATION: 0 mm
TB Skin Test: NEGATIVE

## 2015-11-21 ENCOUNTER — Other Ambulatory Visit: Payer: Self-pay | Admitting: Internal Medicine

## 2015-12-03 ENCOUNTER — Other Ambulatory Visit: Payer: PRIVATE HEALTH INSURANCE

## 2015-12-03 DIAGNOSIS — Z111 Encounter for screening for respiratory tuberculosis: Secondary | ICD-10-CM

## 2015-12-05 LAB — QUANTIFERON TB GOLD ASSAY (BLOOD)
Interferon Gamma Release Assay: NEGATIVE
Mitogen-Nil: >10 IU/mL
QUANTIFERON TB AG MINUS NIL: 0 [IU]/mL
Quantiferon Nil Value: 0.02 IU/mL

## 2016-02-24 ENCOUNTER — Encounter: Payer: Self-pay | Admitting: Internal Medicine

## 2016-02-24 ENCOUNTER — Ambulatory Visit (INDEPENDENT_AMBULATORY_CARE_PROVIDER_SITE_OTHER): Payer: PRIVATE HEALTH INSURANCE | Admitting: Internal Medicine

## 2016-02-24 VITALS — BP 122/66 | HR 62 | Temp 98.2°F | Resp 18 | Ht 64.0 in

## 2016-02-24 DIAGNOSIS — N910 Primary amenorrhea: Secondary | ICD-10-CM

## 2016-02-24 MED ORDER — MEDROXYPROGESTERONE ACETATE 150 MG/ML IM SUSP: 150.0000 mg | INTRAMUSCULAR | 3 refills | Status: DC

## 2016-02-24 MED ORDER — AMPHETAMINE-DEXTROAMPHETAMINE 10 MG PO TABS: ORAL_TABLET | ORAL | 0 refills | Status: DC

## 2016-02-24 NOTE — Progress Notes (Signed)
   Subjective:    Patient ID: Kelsey Wheeler, female    DOB: 09/11/1995, 20 y.o.   MRN: [id number]  HPI  Patient presents to the office for evaluation of irregular periods.  She reports that she has a pattern of irregular periods.  She has times where she is 28-30 days apart.  She reports that she was on a birth control pill and she didn't like it so she stopped it.  She reports that she has not liked the pills she has been on.  She was last on menastra.  She reports that she has been off any birth control for a while.  She reports that she is now 41 days from her last period.  Periods can be really heavy.  She did have some spotting in between her period.  She reports that she is not sure about having not having a period.  She has not tried any other birth control forms.  She reports that she is currently sexually active with her partner.  She reports that she did have protected sex with him using a condom.  She did take a pregnancy test and she had 1 positive test and then she had 6 negative tests.    Review of Systems  Genitourinary: Negative for vaginal bleeding, vaginal discharge and vaginal pain.       Objective:   Physical Exam  Constitutional: She is oriented to person, place, and time. She appears well-developed and well-nourished. No distress.  HENT:  Head: Normocephalic.  Mouth/Throat: Oropharynx is clear and moist. No oropharyngeal exudate.  Eyes: Conjunctivae are normal. No scleral icterus.  Neck: Normal range of motion. Neck supple. No JVD present. No thyromegaly present.  Cardiovascular: Normal rate, regular rhythm, normal heart sounds and intact distal pulses.  Exam reveals no gallop and no friction rub.   No murmur heard. Pulmonary/Chest: Effort normal and breath sounds normal. No respiratory distress. She has no wheezes. She has no rales. She exhibits no tenderness.  Abdominal: Soft. Bowel sounds are normal. She exhibits no distension and no mass. There is no tenderness. There  is no rebound and no guarding.  Musculoskeletal: Normal range of motion.  Lymphadenopathy:    She has no cervical adenopathy.  Neurological: She is alert and oriented to person, place, and time.  Skin: Skin is warm and dry. She is not diaphoretic.  Psychiatric: She has a normal mood and affect. Her behavior is normal. Judgment and thought content normal.  Nursing note and vitals reviewed.   Vitals:   02/24/16 1347  BP: 122/66  Pulse: 62  Resp: 18  Temp: 98.2 F (36.8 C)          Assessment & Plan:    1. Delayed menses -if pregnancy test is negative will proceed with depoprovera shots -discussed different types of birth control including the risk of spotting, irregular bleeding, weight gain, acne, and pain at injection site.    -if positive recommend that we send to Obgyn for obstetric care - hCG, quantitative, pregnancy

## 2016-02-25 ENCOUNTER — Encounter (INDEPENDENT_AMBULATORY_CARE_PROVIDER_SITE_OTHER): Payer: Self-pay

## 2016-02-25 ENCOUNTER — Encounter: Payer: Self-pay | Admitting: *Deleted

## 2016-02-25 LAB — HCG, QUANTITATIVE, PREGNANCY

## 2016-03-16 ENCOUNTER — Ambulatory Visit (INDEPENDENT_AMBULATORY_CARE_PROVIDER_SITE_OTHER): Payer: PRIVATE HEALTH INSURANCE | Admitting: *Deleted

## 2016-03-16 DIAGNOSIS — Z23 Encounter for immunization: Secondary | ICD-10-CM

## 2016-06-21 ENCOUNTER — Ambulatory Visit (INDEPENDENT_AMBULATORY_CARE_PROVIDER_SITE_OTHER): Payer: Managed Care, Other (non HMO) | Admitting: Internal Medicine

## 2016-06-21 ENCOUNTER — Encounter: Payer: Self-pay | Admitting: Internal Medicine

## 2016-06-21 VITALS — BP 104/66 | HR 86 | Temp 98.2°F | Resp 16 | Ht 64.0 in

## 2016-06-21 DIAGNOSIS — J069 Acute upper respiratory infection, unspecified: Secondary | ICD-10-CM

## 2016-06-21 MED ORDER — DEXAMETHASONE SODIUM PHOSPHATE 10 MG/ML IJ SOLN
10.0000 mg | Freq: Once | INTRAMUSCULAR | Status: AC
  Administered 2016-06-21: 10 mg via INTRAMUSCULAR

## 2016-06-21 MED ORDER — FLUTICASONE PROPIONATE 50 MCG/ACT NA SUSP: 2.0000 | Freq: Every day | NASAL | 0 refills | Status: DC

## 2016-06-21 MED ORDER — AZITHROMYCIN 250 MG PO TABS: ORAL_TABLET | ORAL | 0 refills | Status: DC

## 2016-06-21 MED ORDER — PROMETHAZINE-DM 6.25-15 MG/5ML PO SYRP: ORAL_SOLUTION | ORAL | 1 refills | Status: DC

## 2016-06-21 NOTE — Progress Notes (Signed)
HPI  Patient presents to the office for evaluation of cough.  It has been going on for 1 weeks.  Patient reports night > day, wet, worse with lying down, yellow sputum production .  She is also having yellow nasal sputum  They also endorse change in voice, postnasal drip and sinus pressure, no ear pain, no headaches.  .  They have tried dayquil and nyquil.  They report that nothing has worked.  They admits to other sick contacts.  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    Review of Systems  Constitutional: Positive for malaise/fatigue. Negative for chills and fever.  HENT: Positive for congestion, ear pain, hearing loss and sore throat.   Respiratory: Positive for cough. Negative for sputum production, shortness of breath and wheezing.   Cardiovascular: Negative for chest pain, palpitations and leg swelling.  Neurological: Positive for headaches.    PE:  Vitals:   06/21/16 1030  BP: 104/66  Pulse: 86  Resp: 16  Temp: 98.2 F (36.8 C)    General:  Alert and non-toxic, WDWN, NAD HEENT: NCAT, PERLA, EOM normal, no occular discharge or erythema.  Nasal mucosal edema with sinus tenderness to palpation.  Oropharynx clear with minimal oropharyngeal edema and erythema.  Mucous membranes moist and pink. Neck:  Cervical adenopathy Chest:  RRR no MRGs.  Lungs clear to auscultation A&P with no wheezes rhonchi or rales.   Abdomen: +BS x 4 quadrants, soft, non-tender, no guarding, rigidity, or rebound. Skin: warm and dry no rash Neuro: A&Ox4, CN II-XII grossly intact  Assessment and Plan:   1. Acute URI -phenergan dm -flonase -zpak only to take if fevers or colored sputum -daily antihistamine -can use either benadryl or pepcid or zantac for extra drying of congestion - dexamethasone (DECADRON) injection 10 mg; Inject 1 mL (10 mg total) into the muscle once.

## 2016-12-17 ENCOUNTER — Other Ambulatory Visit: Payer: Managed Care, Other (non HMO)

## 2016-12-17 ENCOUNTER — Ambulatory Visit (INDEPENDENT_AMBULATORY_CARE_PROVIDER_SITE_OTHER): Payer: Commercial Managed Care - PPO | Admitting: *Deleted

## 2016-12-17 DIAGNOSIS — Z111 Encounter for screening for respiratory tuberculosis: Secondary | ICD-10-CM

## 2016-12-17 DIAGNOSIS — Z1159 Encounter for screening for other viral diseases: Secondary | ICD-10-CM

## 2016-12-17 NOTE — Progress Notes (Signed)
Patient here for NV for labs and a PPD that was applied to her right forearm.

## 2016-12-20 LAB — MEASLES/MUMPS/RUBELLA IMMUNITY
Mumps IgG: 77.7 AU/mL — ABNORMAL HIGH (ref <9.00)
Rubella: 3.95 Index — ABNORMAL HIGH (ref <0.90)
Rubeola IgG: 191 AU/mL — ABNORMAL HIGH (ref <25.00)

## 2017-01-25 ENCOUNTER — Ambulatory Visit (INDEPENDENT_AMBULATORY_CARE_PROVIDER_SITE_OTHER): Payer: Managed Care, Other (non HMO) | Admitting: Physician Assistant

## 2017-01-25 ENCOUNTER — Encounter: Payer: Self-pay | Admitting: Physician Assistant

## 2017-01-25 VITALS — BP 122/80 | HR 91 | Temp 97.7°F | Resp 16 | Ht 63.0 in | Wt 155.0 lb

## 2017-01-25 DIAGNOSIS — Z30011 Encounter for initial prescription of contraceptive pills: Secondary | ICD-10-CM | POA: Diagnosis not present

## 2017-01-25 DIAGNOSIS — N644 Mastodynia: Secondary | ICD-10-CM | POA: Diagnosis not present

## 2017-01-25 LAB — POCT URINE PREGNANCY: PREG TEST UR: NEGATIVE

## 2017-01-25 MED ORDER — NORETHIN ACE-ETH ESTRAD-FE 1-20 MG-MCG PO TABS: 1.0000 | ORAL_TABLET | Freq: Every day | ORAL | 4 refills | Status: DC

## 2017-01-25 NOTE — Patient Instructions (Signed)

## 2017-01-25 NOTE — Progress Notes (Signed)
   Subjective:    Patient ID: Kelsey Wheeler, female    DOB: 01/23/1996, 21 y.o.   MRN: [id number]  HPI 21 y.o. WF presents for follow up for BCP.  She was on depo for 6 months, got off of that in March/april, has still not had normal menses. She is married, in school and would like to wait 1-2 years before her and her husband start to try for pregnancy. Has tried Nuvaring but did not do well with it.   She also complains of nipple soreness x 2 weeks, has taken 2 negative pregnancy tests at home. Denies increase caffeine, salt. No discharge or nipple changes.    Blood pressure 122/80, pulse 91, temperature 97.7 F (36.5 C), resp. rate 16, height 5\' 3"  (1.6 m), weight 155 lb (70.3 kg), last menstrual period 01/05/2017, SpO2 99 %.  Patient's last menstrual period was 01/05/2017.  Medications No current outpatient prescriptions on file prior to visit.   No current facility-administered medications on file prior to visit.     Problem list She has Hx of thyroiditis and ADD (attention deficit disorder) on her problem list.  Review of Systems  Constitutional: Negative.   HENT: Negative.   Respiratory: Negative.   Cardiovascular: Negative.   Gastrointestinal: Negative.   Genitourinary: Negative.   Musculoskeletal: Negative.   Skin: Negative.   Neurological: Negative.   Hematological: Negative.   Psychiatric/Behavioral: Negative.        Objective:   Physical Exam  Constitutional: She is oriented to person, place, and time. She appears well-developed and well-nourished.  HENT:  Head: Normocephalic and atraumatic.  Right Ear: External ear normal.  Left Ear: External ear normal.  Mouth/Throat: Oropharynx is clear and moist.  Eyes: Pupils are equal, round, and reactive to light. Conjunctivae and EOM are normal.  Neck: Normal range of motion. Neck supple. No thyromegaly present.  Cardiovascular: Normal rate, regular rhythm and normal heart sounds.  Exam reveals no gallop and no friction  rub.   No murmur heard. Pulmonary/Chest: Effort normal and breath sounds normal. No respiratory distress. She has no wheezes. She exhibits no mass. Right breast exhibits tenderness. Right breast exhibits no inverted nipple, no mass, no nipple discharge and no skin change. Left breast exhibits tenderness. Left breast exhibits no inverted nipple, no mass, no nipple discharge and no skin change. Breasts are symmetrical.  Abdominal: Soft. Bowel sounds are normal. She exhibits no distension and no mass. There is no tenderness. There is no rebound and no guarding.  Musculoskeletal: Normal range of motion.  Lymphadenopathy:    She has no cervical adenopathy.  Neurological: She is alert and oriented to person, place, and time. She displays normal reflexes. No cranial nerve deficit. Coordination normal.  Skin: Skin is warm and dry.  Psychiatric: She has a normal mood and affect.      Assessment & Plan:   Encounter for initial prescription of contraceptive pills -     norethindrone-ethinyl estradiol (JUNEL FE,GILDESS FE,LOESTRIN FE) 1-20 MG-MCG tablet; Take 1 tablet by mouth daily. -     POCT urine pregnancy  Nipple pain Normal breast exam Decrease sugar/salt/caffeine.  ? From hormone change -     POCT urine pregnancy   No future appointments.

## 2017-02-16 ENCOUNTER — Other Ambulatory Visit (INDEPENDENT_AMBULATORY_CARE_PROVIDER_SITE_OTHER): Payer: Managed Care, Other (non HMO)

## 2017-02-16 DIAGNOSIS — Z111 Encounter for screening for respiratory tuberculosis: Secondary | ICD-10-CM | POA: Diagnosis not present

## 2017-02-16 NOTE — Progress Notes (Signed)
Pt present for TB gold for school.

## 2017-02-18 LAB — QUANTIFERON TB GOLD ASSAY (BLOOD)
QUANTIFERON TB AG MINUS NIL: 0.01 [IU]/mL
QUANTIFERON(R)-TB GOLD: NEGATIVE
Quantiferon Nil Value: 0.04 IU/mL

## 2017-03-15 ENCOUNTER — Ambulatory Visit (INDEPENDENT_AMBULATORY_CARE_PROVIDER_SITE_OTHER): Payer: Managed Care, Other (non HMO)

## 2017-03-15 DIAGNOSIS — Z23 Encounter for immunization: Secondary | ICD-10-CM | POA: Diagnosis not present

## 2017-04-28 ENCOUNTER — Other Ambulatory Visit: Payer: Self-pay | Admitting: Physician Assistant

## 2017-04-28 MED ORDER — AMPHETAMINE-DEXTROAMPHETAMINE 15 MG PO TABS: 15.0000 mg | ORAL_TABLET | Freq: Two times a day (BID) | ORAL | 0 refills | Status: DC

## 2017-06-08 ENCOUNTER — Telehealth: Payer: Self-pay | Admitting: Internal Medicine

## 2017-06-08 MED ORDER — FLUCONAZOLE 150 MG PO TABS: 150.0000 mg | ORAL_TABLET | Freq: Every day | ORAL | 3 refills | Status: DC

## 2017-06-08 NOTE — Addendum Note (Signed)
Addended by: Quentin MullingOLLIER, Alexx Mcburney R on: 06/08/2017 12:26 PM   Modules accepted: Orders

## 2017-06-08 NOTE — Telephone Encounter (Signed)
Yeast infection. Please call in Diflucan to [facility]

## 2017-06-08 NOTE — Telephone Encounter (Signed)
Pt has been made aware of Rx that was sent to pharmacy & pt has already picked up her medicine.

## 2017-07-01 ENCOUNTER — Other Ambulatory Visit: Payer: Self-pay

## 2017-07-18 ENCOUNTER — Other Ambulatory Visit: Payer: Self-pay | Admitting: Physician Assistant

## 2017-07-18 MED ORDER — NORETHIN ACE-ETH ESTRAD-FE 1-20 MG-MCG(24) PO CHEW: CHEWABLE_TABLET | ORAL | 3 refills | Status: DC

## 2017-08-01 NOTE — Progress Notes (Addendum)
Assessment and Plan:  Kelsey Wheeler was seen today for gynecologic exam.  Diagnoses and all orders for this visit:  Vaginitis -     WET PREP BY MOLECULAR PROBE -     HIV antibody -     RPR -     Cytology - PAP -     Urinalysis w microscopic + reflex cultur  Dysuria -     Urinalysis w microscopic + reflex cultur  Amenorrhea POCT - urine pregnancy - negative  Screening for cervical cancer Overdue; will obtain today while performing pelvic exam -  -     Cytology - PAP  Further disposition pending results of labs. Discussed med's effects and SE's.   Over 30 minutes of exam, counseling, chart review, and critical decision making was performed.   No future appointments.   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 She denies abdominal pain, cerivical pain, vaginal discharge other than typical/expected for her, denies nausea/vomiting/abdominal pain, rashes/lesions.   Single female partner, husband, no other contact for 8 years. No STI hx, has not had comprehensive STI testing ever, has had HPV vaccines 3/3.   No hx of STIs, last pelvic/PAP  2013 - unremarkable, G0P0-  Patient is not currently having periods (Reason: Other). Reports has been very irregular with just intermittent spotting. Had recent vaginal yeast infection 05/2017- 06/2017 - symptoms resolved with diflucan.   Currently on norethin Ace-Eth Estrad-FE 1-20 MG-MCG daily for birth control; was initiated 01/2017. Has poorly tolerated nuvaring in the past, was discussing desire for conception in 1-2 years.   Past Medical History:  Diagnosis Date  . ADD (attention deficit disorder) 03/03/2014  .  Hx of thyroiditis      No Known Allergies  Current Outpatient Medications on File Prior to Visit  Medication Sig  . amphetamine-dextroamphetamine (ADDERALL) 15 MG tablet Take 1 tablet 2 (two) times daily by mouth. (Patient taking differently: Take 15 mg by mouth as needed. )  . Multiple Vitamins-Minerals (MULTIVITAMIN ADULTS PO) Take by mouth.  . Norethin Ace-Eth Estrad-FE 1-20 MG-MCG(24) CHEW 1 pill daily  . fluconazole (DIFLUCAN) 150 MG tablet Take 1 tablet (150 mg total) by mouth daily.   No current facility-administered medications on file prior to visit.     ROS: Review of Systems  Constitutional: Negative for chills, fever, malaise/fatigue and weight loss.  Respiratory: Negative for cough.   Gastrointestinal: Negative for abdominal pain, diarrhea, nausea and vomiting.  Genitourinary: Positive for dysuria. Negative for flank pain, frequency, hematuria and urgency.       + vaginal burning with intercourse      Physical Exam:  BP 124/84   Pulse (!) 103   Temp 97.9 F (36.6 C)   Ht 5\' 3"  (1.6 m)   Wt 157 lb (71.2 kg)   SpO2 99%   BMI 27.81 kg/m   General Appearance: Well nourished, in no apparent distress. Neck: Supple.  Respiratory: Respiratory effort normal, BS equal bilaterally without rales, rhonchi, wheezing or stridor.  Cardio: RRR with no MRGs. Brisk peripheral pulses without edema.  Abdomen: Soft, + BS.  Non tender, no guarding, rebound, hernias, masses. Lymphatics: Non tender without lymphadenopathy.  GU: normal external structures, small amount of thick white mucus present at introitus and vaginal canal without significant notable odor; normal vaginal canal, cervix visualized without lesions, bleeding.   Musculoskeletal: Symmetrical strength, normal gait.  Skin: Warm, dry without rashes, lesions, ecchymosis.  Psych: Awake and oriented X 3, normal affect, Insight and Judgment appropriate.     Dan Maker, NP 5:51 PM [facility]

## 2017-08-02 ENCOUNTER — Other Ambulatory Visit (HOSPITAL_COMMUNITY)
Admission: RE | Admit: 2017-08-02 | Discharge: 2017-08-02 | Disposition: A | Discharge status: Home / Self Care | Payer: Managed Care, Other (non HMO) | Source: Ambulatory Visit | Attending: Adult Health | Admitting: Adult Health

## 2017-08-02 ENCOUNTER — Ambulatory Visit (INDEPENDENT_AMBULATORY_CARE_PROVIDER_SITE_OTHER): Payer: Managed Care, Other (non HMO) | Admitting: Adult Health

## 2017-08-02 ENCOUNTER — Encounter: Payer: Self-pay | Admitting: Adult Health

## 2017-08-02 VITALS — BP 124/84 | HR 103 | Temp 97.9°F | Ht 63.0 in | Wt 157.0 lb

## 2017-08-02 DIAGNOSIS — N949 Unspecified condition associated with female genital organs and menstrual cycle: Secondary | ICD-10-CM

## 2017-08-02 DIAGNOSIS — Z3202 Encounter for pregnancy test, result negative: Secondary | ICD-10-CM

## 2017-08-02 DIAGNOSIS — Z112 Encounter for screening for other bacterial diseases: Secondary | ICD-10-CM | POA: Diagnosis not present

## 2017-08-02 DIAGNOSIS — Z113 Encounter for screening for infections with a predominantly sexual mode of transmission: Secondary | ICD-10-CM | POA: Diagnosis not present

## 2017-08-02 DIAGNOSIS — R300 Dysuria: Secondary | ICD-10-CM | POA: Diagnosis not present

## 2017-08-02 DIAGNOSIS — Z118 Encounter for screening for other infectious and parasitic diseases: Secondary | ICD-10-CM | POA: Diagnosis not present

## 2017-08-02 DIAGNOSIS — N760 Acute vaginitis: Secondary | ICD-10-CM | POA: Diagnosis not present

## 2017-08-02 DIAGNOSIS — Z124 Encounter for screening for malignant neoplasm of cervix: Secondary | ICD-10-CM | POA: Diagnosis not present

## 2017-08-02 DIAGNOSIS — N912 Amenorrhea, unspecified: Secondary | ICD-10-CM | POA: Diagnosis not present

## 2017-08-02 DIAGNOSIS — N9489 Other specified conditions associated with female genital organs and menstrual cycle: Secondary | ICD-10-CM

## 2017-08-02 DIAGNOSIS — Z114 Encounter for screening for human immunodeficiency virus [HIV]: Secondary | ICD-10-CM | POA: Diagnosis not present

## 2017-08-02 LAB — POCT URINE PREGNANCY: Preg Test, Ur: NEGATIVE

## 2017-08-02 NOTE — Addendum Note (Signed)
Addended by: Dionicio StallUCKER, Ahnya Akre on: 08/02/2017 04:01 PM   Modules accepted: Orders

## 2017-08-02 NOTE — Patient Instructions (Signed)
We are checking for several bacteria - may take a few days for all results to come back. Recommend taking it easy with sex in the meantime, use a condom if you do, avoid douching etc which can disrupt normal vaginal flora (bacteria) and cause problems.    Vaginitis Vaginitis is a condition in which the vaginal tissue swells and becomes red (inflamed). This condition is most often caused by a change in the normal balance of bacteria and yeast that live in the vagina. This change causes an overgrowth of certain bacteria or yeast, which causes the inflammation. There are different types of vaginitis, but the most common types are:  Bacterial vaginosis.  Yeast infection (candidiasis).  Trichomoniasis vaginitis. This is a sexually transmitted disease (STD).  Viral vaginitis.  Atrophic vaginitis.  Allergic vaginitis.  What are the causes? The cause of this condition depends on the type of vaginitis. It can be caused by:  Bacteria (bacterial vaginosis).  Yeast, which is a fungus (yeast infection).  A parasite (trichomoniasis vaginitis).  A virus (viral vaginitis).  Low hormone levels (atrophic vaginitis). Low hormone levels can occur during pregnancy, breastfeeding, or after menopause.  Irritants, such as bubble baths, scented tampons, and feminine sprays (allergic vaginitis).  Other factors can change the normal balance of the yeast and bacteria that live in the vagina. These include:  Antibiotic medicines.  Poor hygiene.  Diaphragms, vaginal sponges, spermicides, birth control pills, and intrauterine devices (IUD).  Sex.  Infection.  Uncontrolled diabetes.  A weakened defense (immune) system.  What increases the risk? This condition is more likely to develop in women who:  Smoke.  Use vaginal douches, scented tampons, or scented sanitary pads.  Wear tight-fitting pants.  Wear thong underwear.  Use oral birth control pills or an IUD.  Have sex without a  condom.  Have multiple sex partners.  Have an STD.  Frequently use the spermicide nonoxynol-9.  Eat lots of foods high in sugar.  Have uncontrolled diabetes.  Have low estrogen levels.  Have a weakened immune system from an immune disorder or medical treatment.  Are pregnant or breastfeeding.  What are the signs or symptoms? Symptoms vary depending on the cause of the vaginitis. Common symptoms include:  Abnormal vaginal discharge. ? The discharge is white, gray, or yellow with bacterial vaginosis. ? The discharge is thick, white, and cheesy with a yeast infection. ? The discharge is frothy and yellow or greenish with trichomoniasis.  A bad vaginal smell. The smell is fishy with bacterial vaginosis.  Vaginal itching, pain, or swelling.  Sex that is painful.  Pain or burning when urinating.  Sometimes there are no symptoms. How is this diagnosed? This condition is diagnosed based on your symptoms and medical history. A physical exam, including a pelvic exam, will also be done. You may also have other tests, including:  Tests to determine the pH level (acidity or alkalinity) of your vagina.  A whiff test, to assess the odor that results when a sample of your vaginal discharge is mixed with a potassium hydroxide solution.  Tests of vaginal fluid. A sample will be examined under a microscope.  How is this treated? Treatment varies depending on the type of vaginitis you have. Your treatment may include:  Antibiotic creams or pills to treat bacterial vaginosis and trichomoniasis.  Antifungal medicines, such as vaginal creams or suppositories, to treat a yeast infection.  Medicine to ease discomfort if you have viral vaginitis. Your sexual partner should also be treated.  Estrogen delivered in a cream, pill, suppository, or vaginal ring to treat atrophic vaginitis. If vaginal dryness occurs, lubricants and moisturizing creams may help. You may need to avoid scented  soaps, sprays, or douches.  Stopping use of a product that is causing allergic vaginitis. Then using a vaginal cream to treat the symptoms.  Follow these instructions at home: Lifestyle  Keep your genital area clean and dry. Avoid soap, and only rinse the area with water.  Do not douche or use tampons until your health care provider says it is okay to do so. Use sanitary pads, if needed.  Do not have sex until your health care provider approves. When you can return to sex, practice safe sex and use condoms.  Wipe from front to back. This avoids the spread of bacteria from the rectum to the vagina. General instructions  Take over-the-counter and prescription medicines only as told by your health care provider.  If you were prescribed an antibiotic medicine, take or use it as told by your health care provider. Do not stop taking or using the antibiotic even if you start to feel better.  Keep all follow-up visits as told by your health care provider. This is important. How is this prevented?  Use mild, non-scented products. Do not use things that can irritate the vagina, such as fabric softeners. Avoid the following products if they are scented: ? Feminine sprays. ? Detergents. ? Tampons. ? Feminine hygiene products. ? Soaps or bubble baths.  Let air reach your genital area. ? Wear cotton underwear to reduce moisture buildup. ? Avoid wearing underwear while you sleep. ? Avoid wearing tight pants and underwear or nylons without a cotton panel. ? Avoid wearing thong underwear.  Take off any wet clothing, such as bathing suits, as soon as possible.  Practice safe sex and use condoms. Contact a health care provider if:  You have abdominal pain.  You have a fever.  You have symptoms that last for more than 2-3 days. Get help right away if:  You have a fever and your symptoms suddenly get worse. Summary  Vaginitis is a condition in which the vaginal tissue becomes  inflamed.This condition is most often caused by a change in the normal balance of bacteria and yeast that live in the vagina.  Treatment varies depending on the type of vaginitis you have.  Do not douche, use tampons , or have sex until your health care provider approves. When you can return to sex, practice safe sex and use condoms. This information is not intended to replace advice given to you by your health care provider. Make sure you discuss any questions you have with your health care provider. Document Released: 03/28/2007 Document Revised: 07/06/2016 Document Reviewed: 07/06/2016 Elsevier Interactive Patient Education  [employer].

## 2017-08-03 ENCOUNTER — Encounter: Payer: Self-pay | Admitting: Adult Health

## 2017-08-03 LAB — URINALYSIS W MICROSCOPIC + REFLEX CULTURE
BACTERIA UA: NONE SEEN /HPF
Bilirubin Urine: NEGATIVE
Glucose, UA: NEGATIVE
HGB URINE DIPSTICK: NEGATIVE
HYALINE CAST: NONE SEEN /LPF
KETONES UR: NEGATIVE
LEUKOCYTE ESTERASE: NEGATIVE
Nitrites, Initial: NEGATIVE
PROTEIN: NEGATIVE
RBC / HPF: NONE SEEN /HPF (ref 0–2)
SQUAMOUS EPITHELIAL / LPF: NONE SEEN /HPF (ref <=5)
Specific Gravity, Urine: 1.027 (ref 1.001–1.03)
pH: 7 (ref 5.0–8.0)

## 2017-08-03 LAB — WET PREP BY MOLECULAR PROBE
Candida species: NOT DETECTED
Gardnerella vaginalis: NOT DETECTED
MICRO NUMBER: 90219272
SPECIMEN QUALITY: ADEQUATE
TRICHOMONAS VAG: NOT DETECTED

## 2017-08-03 LAB — HIV ANTIBODY (ROUTINE TESTING W REFLEX): HIV 1&2 Ab, 4th Generation: NONREACTIVE

## 2017-08-03 LAB — RPR: RPR: NONREACTIVE

## 2017-08-03 LAB — NO CULTURE INDICATED

## 2017-08-04 LAB — CYTOLOGY - PAP
Chlamydia: NEGATIVE
DIAGNOSIS: NEGATIVE
Neisseria Gonorrhea: NEGATIVE

## 2017-08-30 ENCOUNTER — Other Ambulatory Visit: Payer: Self-pay

## 2017-08-30 MED ORDER — NORETHIN ACE-ETH ESTRAD-FE 1-20 MG-MCG(24) PO CHEW: CHEWABLE_TABLET | ORAL | 0 refills | Status: DC

## 2017-11-17 ENCOUNTER — Other Ambulatory Visit: Payer: Self-pay | Admitting: Physician Assistant

## 2017-12-03 ENCOUNTER — Encounter: Payer: Self-pay | Admitting: Adult Health

## 2017-12-04 ENCOUNTER — Other Ambulatory Visit: Payer: Self-pay | Admitting: Adult Health

## 2017-12-04 MED ORDER — AMPHETAMINE-DEXTROAMPHETAMINE 15 MG PO TABS: 15.0000 mg | ORAL_TABLET | Freq: Two times a day (BID) | ORAL | 0 refills | Status: DC

## 2018-01-09 ENCOUNTER — Other Ambulatory Visit: Payer: Self-pay

## 2018-01-09 MED ORDER — NORETHIN ACE-ETH ESTRAD-FE 1-20 MG-MCG(24) PO CHEW: CHEWABLE_TABLET | ORAL | 0 refills | Status: DC

## 2018-02-23 ENCOUNTER — Ambulatory Visit (INDEPENDENT_AMBULATORY_CARE_PROVIDER_SITE_OTHER): Payer: 59

## 2018-02-23 DIAGNOSIS — Z23 Encounter for immunization: Secondary | ICD-10-CM | POA: Diagnosis not present

## 2018-02-23 NOTE — Progress Notes (Signed)
Patient here for a nurse visit to receive flu vaccine. Patient was given the immunization in the left deltoid. Patient tolerated well without any complications.

## 2018-03-03 ENCOUNTER — Other Ambulatory Visit: Payer: Self-pay | Admitting: Physician Assistant

## 2018-03-03 MED ORDER — NORETHIN-ETH ESTRAD-FE BIPHAS 1 MG-10 MCG / 10 MCG PO TABS: 1.0000 | ORAL_TABLET | Freq: Every day | ORAL | 1 refills | Status: DC

## 2018-03-26 ENCOUNTER — Other Ambulatory Visit: Payer: Self-pay | Admitting: Adult Health

## 2018-04-10 DIAGNOSIS — E559 Vitamin D deficiency, unspecified: Secondary | ICD-10-CM | POA: Insufficient documentation

## 2018-04-10 DIAGNOSIS — E663 Overweight: Secondary | ICD-10-CM | POA: Insufficient documentation

## 2018-04-10 NOTE — Progress Notes (Signed)
Complete Physical  Assessment and Plan:  Kelsey Wheeler was seen today for annual exam.  Diagnoses and all orders for this visit:  Encounter for routine adult health examination without abnormal findings  Hx of thyroiditis Hx of neg uptake imaging, no recent symptoms, monitor -     TSH  Attention deficit disorder (ADD) without hyperactivity Off of meds due to desiring pregnancy  Overweight (BMI 25.0-29.9) Long discussion about weight loss, diet, and exercise Recommended diet heavy in fruits and veggies and low in animal meats, cheeses, and dairy products, appropriate calorie intake Patient will work on increasing activity, fruit/vegetable intake, water intake Discussed appropriate weight for height (below 145 lb)  Follow up at next visit  Vitamin D deficiency -     VITAMIN D 25 Hydroxy (Vit-D Deficiency, Fractures)  Medication management -     CBC with Differential/Platelet -     COMPLETE METABOLIC PANEL WITH GFR -     Urinalysis w microscopic + reflex cultur  Screening for deficiency anemia -     CBC with Differential/Platelet -     Vitamin B12  Irregular menses/acne/central obesity Discussed with patient, she is concerned about possible PCOS, will check testosterone, can check [place] if abnormal Recommended she get OBGYN -     Testosterone  Desire for pregnancy Discussed needs to get on prenatal, lifestyle modification discussed at length Recommended she find OBGYN prior to conception   Discussed med's effects and SE's. Screening labs and tests as requested with regular follow-up as recommended. Over 40 minutes of exam, counseling, chart review, and complex, high level critical decision making was performed this visit.   No future appointments.   HPI  22 y.o. female  presents for a complete physical and follow up for has Hx of thyroiditis; ADD (attention deficit disorder); Overweight (BMI 25.0-29.9); and Vitamin D deficiency on their problem list.   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   Currently off of birth control x 6 weeks without menses. She reports she has taken several OTC pregnnacy tests which were negative. She reports hx of irregular menses, acne, and is concerned about possible PCOS. She had PAP 07/2017 with comprehensive negative STI testing. Does not have OBGYN. Not on prenatal.  Patient has been off of ADD meds due to trying to conceive.   BMI is Body mass index is 29.57 kg/m., she has not been working on diet and exercise. She admits to junk food on days off. She admits water intake is poor due to working schedule.  Wt Readings from Last 3 Encounters:  04/12/18 175 lb (79.4 kg)  08/02/17 157 lb (71.2 kg)  01/25/17 155 lb (70.3 kg)   Today their BP is BP: 124/78 She does not workout. She denies chest pain, shortness of breath, dizziness.   The cholesterol last check was:   Lab Results  Component Value Date   CHOL 132 02/26/2014   HDL 59 02/26/2014   LDLCALC 50 02/26/2014   TRIG 115 02/26/2014   CHOLHDL 2.2 02/26/2014   Last Q6V in the office was:  Lab Results  Component Value Date   HGBA1C 5.1 02/26/2014   Last GFR: Lab Results  Component Value Date   GFRNONAA 84 02/26/2014   She has hx of thyroiditis but is not on any thyroid medication.  Lab Results  Component Value Date   TSH 1.125 03/18/2015   Patient is on Vitamin D supplement.   Lab Results  Component Value Date  VD25OH 35 02/26/2014      Current Medications:  Current Outpatient Medications on File Prior to Visit  Medication Sig Dispense Refill  . amphetamine-dextroamphetamine (ADDERALL) 15 MG tablet Take 1 tablet by mouth 2 (two) times daily. 60 tablet 0  . Multiple Vitamins-Minerals (MULTIVITAMIN ADULTS PO) Take by mouth.    . Norethin Ace-Eth Estrad-FE 1-20 MG-MCG(24) CHEW TAKE 1 TABLET BY MOUTH EVERY DAY 84 tablet 0  . Norethindrone-Ethinyl Estradiol-Fe Biphas (LO LOESTRIN FE) 1 MG-10 MCG / 10 MCG tablet Take 1 tablet by mouth  daily. 3 Package 1   No current facility-administered medications on file prior to visit.    Allergies:  No Known Allergies Medical History:  She has Hx of thyroiditis; ADD (attention deficit disorder); Overweight (BMI 25.0-29.9); and Vitamin D deficiency on their problem list. Health Maintenance:   Immunization History  Administered Date(s) Administered  . HPV Quadrivalent 03/11/2008, 05/17/2008, 09/18/2008  . Hepatitis B 12/06/2011  . Influenza Inj Mdck Quad With Preservative 03/15/2017, 02/23/2018  . Influenza Split 03/29/2013, 03/12/2014  . Influenza,inj,quad, With Preservative 03/16/2016  . PPD Test 11/15/2014, 11/05/2015, 12/17/2016  . Tdap 09/07/2011    Tetanus: 2013 Flu vaccine: 02/2018 HPV: 3/3  LMP: No LMP recorded (lmp unknown). (Menstrual status: Irregular Periods). Pap: 07/2017 neg,  MGM: n/a DEXA:  Colonoscopy: - EGD: -  Last Dental Exam: Dr. ?, last 2 years ago  Last Eye Exam: Dr. Hyacinth Wheeler, last 03/2018, wears contacts  Patient Care Team: Lucky Cowboy, MD as PCP - General (Internal Medicine)  Surgical History:  She has no past surgical history on file. Family History:  Herfamily history includes Atrial fibrillation in her paternal grandmother; Breast cancer in her maternal grandmother; Diabetes in her paternal grandfather and paternal grandmother; Heart disease in her paternal grandfather; Heart failure in her paternal grandfather; Hyperlipidemia in her father, maternal grandmother, mother, and paternal grandfather; Hypertension in her maternal grandmother; Lung cancer in her maternal grandfather and maternal grandmother. Social History:  She reports that she has never smoked. She has never used smokeless tobacco. She reports that she drank alcohol. She reports that she does not use drugs.  Review of Systems: Review of Systems  Constitutional: Negative for malaise/fatigue and weight loss.  HENT: Negative for hearing loss and tinnitus.   Eyes: Negative  for blurred vision and double vision.  Respiratory: Negative for cough, shortness of breath and wheezing.   Cardiovascular: Negative for chest pain, palpitations, orthopnea, claudication and leg swelling.  Gastrointestinal: Negative for abdominal pain, blood in stool, constipation, diarrhea, heartburn, melena, nausea and vomiting.  Genitourinary: Negative.   Musculoskeletal: Negative for joint pain and myalgias.  Skin: Negative for rash.  Neurological: Negative for dizziness, tingling, sensory change, weakness and headaches.  Endo/Heme/Allergies: Negative for polydipsia.  Psychiatric/Behavioral: Negative.   All other systems reviewed and are negative.   Physical Exam: Estimated body mass index is 29.57 kg/m as calculated from the following:   Height as of this encounter: 5' 4.5" (1.638 m).   Weight as of this encounter: 175 lb (79.4 kg). BP 124/78   Pulse 86   Temp (!) 97.5 F (36.4 C)   Ht 5' 4.5" (1.638 m)   Wt 175 lb (79.4 kg)   LMP  (LMP Unknown)   SpO2 98%   BMI 29.57 kg/m  General Appearance: Well nourished, in no apparent distress.  Eyes: PERRLA, EOMs, conjunctiva no swelling or erythema, normal fundi and vessels.  Sinuses: No Frontal/maxillary tenderness  ENT/Mouth: Ext aud canals clear, normal light reflex  with TMs without erythema, bulging. Good dentition. No erythema, swelling, or exudate on post pharynx. Tonsils not swollen or erythematous. Hearing normal.  Neck: Supple, thyroid mildly enlarged. No bruits  Respiratory: Respiratory effort normal, BS equal bilaterally without rales, rhonchi, wheezing or stridor.  Cardio: RRR without murmurs, rubs or gallops. Brisk peripheral pulses without edema.  Chest: symmetric, with normal excursions and percussion.  Breasts: Symmetric, without lumps, nipple discharge, retractions.  Abdomen: Soft, nontender, no guarding, rebound, hernias, masses, or organomegaly.  Lymphatics: Non tender without lymphadenopathy.  Genitourinary:  Defer, just had Musculoskeletal: Full ROM all peripheral extremities,5/5 strength, and normal gait.  Skin: Warm, dry without rashes, lesions, ecchymosis. Neuro: Cranial nerves intact, reflexes equal bilaterally. Normal muscle tone, no cerebellar symptoms. Sensation intact.  Psych: Awake and oriented X 3, normal affect, Insight and Judgment appropriate.   EKG: defer  Dan Maker 9:33 AM [facility]

## 2018-04-12 ENCOUNTER — Ambulatory Visit (INDEPENDENT_AMBULATORY_CARE_PROVIDER_SITE_OTHER): Payer: 59 | Admitting: Adult Health

## 2018-04-12 ENCOUNTER — Encounter: Payer: Self-pay | Admitting: Adult Health

## 2018-04-12 VITALS — BP 124/78 | HR 86 | Temp 97.5°F | Ht 64.5 in | Wt 175.0 lb

## 2018-04-12 DIAGNOSIS — Z130 Encounter for screening for diseases of the blood and blood-forming organs and certain disorders involving the immune mechanism: Secondary | ICD-10-CM | POA: Diagnosis not present

## 2018-04-12 DIAGNOSIS — Z8639 Personal history of other endocrine, nutritional and metabolic disease: Secondary | ICD-10-CM

## 2018-04-12 DIAGNOSIS — Z79899 Other long term (current) drug therapy: Secondary | ICD-10-CM | POA: Diagnosis not present

## 2018-04-12 DIAGNOSIS — Z319 Encounter for procreative management, unspecified: Secondary | ICD-10-CM

## 2018-04-12 DIAGNOSIS — N926 Irregular menstruation, unspecified: Secondary | ICD-10-CM

## 2018-04-12 DIAGNOSIS — E559 Vitamin D deficiency, unspecified: Secondary | ICD-10-CM | POA: Diagnosis not present

## 2018-04-12 DIAGNOSIS — Z0000 Encounter for general adult medical examination without abnormal findings: Secondary | ICD-10-CM

## 2018-04-12 DIAGNOSIS — Z1389 Encounter for screening for other disorder: Secondary | ICD-10-CM | POA: Diagnosis not present

## 2018-04-12 DIAGNOSIS — Z13220 Encounter for screening for lipoid disorders: Secondary | ICD-10-CM | POA: Diagnosis not present

## 2018-04-12 DIAGNOSIS — E65 Localized adiposity: Secondary | ICD-10-CM | POA: Diagnosis not present

## 2018-04-12 DIAGNOSIS — Z131 Encounter for screening for diabetes mellitus: Secondary | ICD-10-CM

## 2018-04-12 DIAGNOSIS — F988 Other specified behavioral and emotional disorders with onset usually occurring in childhood and adolescence: Secondary | ICD-10-CM

## 2018-04-12 DIAGNOSIS — E663 Overweight: Secondary | ICD-10-CM

## 2018-04-12 DIAGNOSIS — L709 Acne, unspecified: Secondary | ICD-10-CM | POA: Diagnosis not present

## 2018-04-12 NOTE — Patient Instructions (Addendum)
Ms. Kelsey Wheeler , Thank you for taking time to come for your Annual Wellness Visit. I appreciate your ongoing commitment to your health goals. Please review the following plan we discussed and let me know if I can assist you in the future.   These are the goals we discussed: Goals    . DIET - INCREASE WATER INTAKE     65-80+ fluid ounces of clear liquids daily    . Exercise 3x per week (30 min per time)    . Weight (lb) < 145 lb (65.8 kg)       This is a list of the screening recommended for you and due dates:  Health Maintenance  Topic Date Due  . Pap Smear  08/02/2020  . Tetanus Vaccine  09/06/2021  . Flu Shot  Completed  . HIV Screening  Completed    Recommend cutting down on sugar intake  - the American Heart Association recommends no more than 9 teaspoons (38 g) of added sugar for men daily, and 6 teaspoons (25 g) for women. Added sugar can be in many things that you might not expect - salad dressings, bread that is not home made, "all natural" fruit juice, etc., most processed foods contain hidden sugars. Consider looking at labels and being aware of how much sugar you are consuming in a day. Less is always better for sugar; sugar reduces your body's immune response, and damages blood vessels, leading to increased risk of many diseases.     Know what a healthy weight is for you (roughly BMI <25) and aim to maintain this  Aim for 7+ servings of fruits and vegetables daily  65-80+ fluid ounces of water or unsweet tea for healthy kidneys  Limit to max 1 drink of alcohol per day; avoid smoking/tobacco  Limit animal fats in diet for cholesterol and heart health - choose grass fed whenever available  Avoid highly processed foods, and foods high in saturated/trans fats  Aim for low stress - take time to unwind and care for your mental health  Aim for 150 min of moderate intensity exercise weekly for heart health, and weights twice weekly for bone health  Aim for 7-9 hours of  sleep daily        When it comes to diets, agreement about the perfect plan isn't easy to find, even among the experts. Experts at the [facility] [employer] developed an idea known as the Healthy Eating Plate. Just imagine a plate divided into logical, healthy portions.  The emphasis is on diet quality:  Load up on vegetables and fruits - one-half of your plate: Aim for color and variety, and remember that potatoes don't count.  Go for whole grains - one-quarter of your plate: Whole wheat, barley, wheat berries, quinoa, oats, brown rice, and foods made with them. If you want pasta, go with whole wheat pasta.  Protein power - one-quarter of your plate: Fish, chicken, beans, and nuts are all healthy, versatile protein sources. Limit red meat.  The diet, however, does go beyond the plate, offering a few other suggestions.  Use healthy plant oils, such as olive, canola, soy, corn, sunflower and peanut. Check the labels, and avoid partially hydrogenated oil, which have unhealthy trans fats.  If you're thirsty, drink water. Coffee and tea are good in moderation, but skip sugary drinks and limit milk and dairy products to one or two daily servings.  The type of carbohydrate in the diet is more important than the amount.  Some sources of carbohydrates, such as vegetables, fruits, whole grains, and beans-are healthier than others.  Finally, stay active.      Preparing for Pregnancy If you are considering becoming pregnant, make an appointment to see your regular health care provider to learn how to prepare for a safe and healthy pregnancy (preconception care). During a preconception care visit, your health care provider will:  Do a complete physical exam, including a Pap test.  Take a complete medical history.  Give you information, answer your questions, and help you resolve problems.  Preconception checklist Medical history  Tell your health care provider about any  current or past medical conditions. Your pregnancy or your ability to become pregnant may be affected by chronic conditions, such as diabetes, chronic hypertension, and thyroid problems.  Include your family's medical history as well as your partner's medical history.  Tell your health care provider about any history of STIs (sexually transmitted infections).These can affect your pregnancy. In some cases, they can be passed to your baby. Discuss any concerns that you have about STIs.  If indicated, discuss the benefits of genetic testing. This testing will show whether there are any genetic conditions that may be passed from you or your partner to your baby.  Tell your health care provider about: ? Any problems you have had with conception or pregnancy. ? Any medicines you take. These include vitamins, herbal supplements, and over-the-counter medicines. ? Your history of immunizations. Discuss any vaccinations that you may need.  Diet  Ask your health care provider what to include in a healthy diet that has a balance of nutrients. This is especially important when you are pregnant or preparing to become pregnant.  Ask your health care provider to help you reach a healthy weight before pregnancy. ? If you are overweight, you may be at higher risk for certain complications, such as high blood pressure, diabetes, and preterm birth. ? If you are underweight, you are more likely to have a baby who has a low birth weight.  Lifestyle, work, and home  Let your health care provider know: ? About any lifestyle habits that you have, such as alcohol use, drug use, or smoking. ? About recreational activities that may put you at risk during pregnancy, such as downhill skiing and certain exercise programs. ? Tell your health care provider about any international travel, especially any travel to places with an active [place] virus outbreak. ? About harmful substances that you may be exposed to at work or at  home. These include chemicals, pesticides, radiation, or even litter boxes. ? If you do not feel safe at home.  Mental health  Tell your health care provider about: ? Any history of mental health conditions, including feelings of depression, sadness, or anxiety. ? Any medicines that you take for a mental health condition. These include herbs and supplements.  Home instructions to prepare for pregnancy Lifestyle  Eat a balanced diet. This includes fresh fruits and vegetables, whole grains, lean meats, low-fat dairy products, healthy fats, and foods that are high in fiber. Ask to meet with a nutritionist or registered dietitian for assistance with meal planning and goals.  Get regular exercise. Try to be active for at least 30 minutes a day on most days of the week. Ask your health care provider which activities are safe during pregnancy.  Do not use any products that contain nicotine or tobacco, such as cigarettes and e-cigarettes. If you need help quitting, ask your health care provider.  Do not drink alcohol.  Do not take illegal drugs.  Maintain a healthy weight. Ask your health care provider what weight range is right for you.  General instructions  Keep an accurate record of your menstrual periods. This makes it easier for your health care provider to determine your baby's due date.  Begin taking prenatal vitamins and folic acid supplements daily as directed by your health care provider.  Manage any chronic conditions, such as high blood pressure and diabetes, as told by your health care provider. This is important.  How do I know that I am pregnant? You may be pregnant if you have been sexually active and you miss your period. Symptoms of early pregnancy include:  Mild cramping.  Very light vaginal bleeding (spotting).  Feeling unusually tired.  Nausea and vomiting (morning sickness).  If you have any of these symptoms and you suspect that you might be pregnant, you  can take a home pregnancy test. These tests check for a hormone in your urine (human chorionic gonadotropin, or hCG). A woman's body begins to make this hormone during early pregnancy. These tests are very accurate. Wait until at least the first day after you miss your period to take one. If the test shows that you are pregnant (you get a positive result), call your health care provider to make an appointment for prenatal care. What should I do if I become pregnant?  Make an appointment with your health care provider as soon as you suspect you are pregnant.  Do not use any products that contain nicotine, such as cigarettes, chewing tobacco, and e-cigarettes. If you need help quitting, ask your health care provider.  Do not drink alcoholic beverages. Alcohol is related to a number of birth defects.  Avoid toxic odors and chemicals.  You may continue to have sexual intercourse if it does not cause pain or other problems, such as vaginal bleeding. This information is not intended to replace advice given to you by your health care provider. Make sure you discuss any questions you have with your health care provider. Document Released: 05/13/2008 Document Revised: 01/27/2016 Document Reviewed: 12/21/2015 Elsevier Interactive Patient Education  [employer].

## 2018-04-12 NOTE — Addendum Note (Signed)
Addended by: Dionicio Stall on: 04/12/2018 09:44 AM   Modules accepted: Orders

## 2018-04-13 ENCOUNTER — Encounter: Payer: Self-pay | Admitting: Adult Health

## 2018-04-13 ENCOUNTER — Other Ambulatory Visit: Payer: Self-pay | Admitting: Adult Health

## 2018-04-13 DIAGNOSIS — R7989 Other specified abnormal findings of blood chemistry: Secondary | ICD-10-CM

## 2018-04-13 DIAGNOSIS — R945 Abnormal results of liver function studies: Secondary | ICD-10-CM

## 2018-04-14 LAB — URINALYSIS W MICROSCOPIC + REFLEX CULTURE
BILIRUBIN URINE: NEGATIVE
Bacteria, UA: NONE SEEN /HPF
GLUCOSE, UA: NEGATIVE
HGB URINE DIPSTICK: NEGATIVE
Hyaline Cast: NONE SEEN /LPF
KETONES UR: NEGATIVE
LEUKOCYTE ESTERASE: NEGATIVE
NITRITES URINE, INITIAL: NEGATIVE
PH: 6.5 (ref 5.0–8.0)
Protein, ur: NEGATIVE
SPECIFIC GRAVITY, URINE: 1.008 (ref 1.001–1.03)
WBC UA: NONE SEEN /HPF (ref 0–5)

## 2018-04-14 LAB — COMPLETE METABOLIC PANEL WITH GFR
AG Ratio: 1.8 (calc) (ref 1.0–2.5)
ALKALINE PHOSPHATASE (APISO): 77 U/L (ref 33–115)
ALT: 62 U/L — ABNORMAL HIGH (ref 6–29)
AST: 33 U/L — AB (ref 10–30)
Albumin: 4.6 g/dL (ref 3.6–5.1)
BUN: 15 mg/dL (ref 7–25)
CALCIUM: 9.6 mg/dL (ref 8.6–10.2)
CO2: 24 mmol/L (ref 20–32)
CREATININE: 0.94 mg/dL (ref 0.50–1.10)
Chloride: 104 mmol/L (ref 98–110)
GFR, EST NON AFRICAN AMERICAN: 85 mL/min/{1.73_m2} (ref >=60)
GFR, Est African American: 98 mL/min/{1.73_m2} (ref >=60)
GLOBULIN: 2.5 g/dL (ref 1.9–3.7)
GLUCOSE: 101 mg/dL — AB (ref 65–99)
Potassium: 4 mmol/L (ref 3.5–5.3)
SODIUM: 138 mmol/L (ref 135–146)
Total Bilirubin: 0.4 mg/dL (ref 0.2–1.2)
Total Protein: 7.1 g/dL (ref 6.1–8.1)

## 2018-04-14 LAB — CBC WITH DIFFERENTIAL/PLATELET
Basophils Absolute: 82 cells/uL (ref 0–200)
Basophils Relative: 0.6 %
EOS PCT: 0.5 %
Eosinophils Absolute: 69 cells/uL (ref 15–500)
HEMATOCRIT: 44.3 % (ref 35.0–45.0)
HEMOGLOBIN: 15.3 g/dL (ref 11.7–15.5)
LYMPHS ABS: 3315 {cells}/uL (ref 850–3900)
MCH: 31.2 pg (ref 27.0–33.0)
MCHC: 34.5 g/dL (ref 32.0–36.0)
MCV: 90.4 fL (ref 80.0–100.0)
MONOS PCT: 5.3 %
MPV: 11.4 fL (ref 7.5–12.5)
NEUTROS PCT: 69.4 %
Neutro Abs: 9508 cells/uL — ABNORMAL HIGH (ref 1500–7800)
Platelets: 300 10*3/uL (ref 140–400)
RBC: 4.9 10*6/uL (ref 3.80–5.10)
RDW: 12.4 % (ref 11.0–15.0)
Total Lymphocyte: 24.2 %
WBC mixed population: 726 cells/uL (ref 200–950)
WBC: 13.7 10*3/uL — AB (ref 3.8–10.8)

## 2018-04-14 LAB — TESTOSTERONE, TOTAL, LC/MS/MS: Testosterone, Total, LC-MS-MS: 46 ng/dL — ABNORMAL HIGH (ref 2–45)

## 2018-04-14 LAB — IRON, TOTAL/TOTAL IRON BINDING CAP
%SAT: 24 % (ref 16–45)
IRON: 79 ug/dL (ref 40–190)
TIBC: 324 ug/dL (ref 250–450)

## 2018-04-14 LAB — TSH: TSH: 1.89 m[IU]/L

## 2018-04-14 LAB — VITAMIN B12: Vitamin B-12: 349 pg/mL (ref 200–1100)

## 2018-04-14 LAB — VITAMIN D 25 HYDROXY (VIT D DEFICIENCY, FRACTURES): VIT D 25 HYDROXY: 18 ng/mL — AB (ref 30–100)

## 2018-04-14 LAB — NO CULTURE INDICATED

## 2018-04-16 ENCOUNTER — Other Ambulatory Visit: Payer: Self-pay | Admitting: Adult Health

## 2018-04-16 DIAGNOSIS — R7301 Impaired fasting glucose: Secondary | ICD-10-CM

## 2018-06-14 NOTE — L&D Delivery Note (Signed)
Delivery Note At 12:09 PM a viable and healthy female was delivered via Vaginal, Spontaneous (Presentation: LOA  ).  APGAR: 8, 9; weight  pending.   Placenta status: spontaneous,  intact.  Cord:  with the following complications: none .  Cord pH: na  Anesthesia:  epidural Episiotomy: None Lacerations: 2nd degree Suture Repair: 2.0 vicryl rapide Est. Blood Loss (mL): 249  Mom to postpartum.  Baby to Couplet care / Skin to Skin.  Leondre Taul J 03/02/2019, 12:40 PM

## 2018-06-23 ENCOUNTER — Emergency Department (HOSPITAL_COMMUNITY): Payer: No Typology Code available for payment source

## 2018-06-23 ENCOUNTER — Encounter (HOSPITAL_COMMUNITY): Payer: Self-pay

## 2018-06-23 ENCOUNTER — Emergency Department (HOSPITAL_COMMUNITY)
Admission: EM | Admit: 2018-06-23 | Discharge: 2018-06-23 | Disposition: A | Discharge status: Home / Self Care | Payer: No Typology Code available for payment source | Attending: Emergency Medicine | Admitting: Emergency Medicine

## 2018-06-23 ENCOUNTER — Other Ambulatory Visit: Payer: Self-pay

## 2018-06-23 DIAGNOSIS — Z331 Pregnant state, incidental: Secondary | ICD-10-CM | POA: Diagnosis not present

## 2018-06-23 DIAGNOSIS — Z79899 Other long term (current) drug therapy: Secondary | ICD-10-CM | POA: Diagnosis not present

## 2018-06-23 DIAGNOSIS — R200 Anesthesia of skin: Secondary | ICD-10-CM | POA: Diagnosis not present

## 2018-06-23 DIAGNOSIS — R000 Tachycardia, unspecified: Secondary | ICD-10-CM | POA: Diagnosis not present

## 2018-06-23 DIAGNOSIS — R202 Paresthesia of skin: Secondary | ICD-10-CM | POA: Diagnosis present

## 2018-06-23 DIAGNOSIS — Z3201 Encounter for pregnancy test, result positive: Secondary | ICD-10-CM

## 2018-06-23 DIAGNOSIS — R531 Weakness: Secondary | ICD-10-CM | POA: Diagnosis not present

## 2018-06-23 LAB — COMPREHENSIVE METABOLIC PANEL
ALBUMIN: 4.1 g/dL (ref 3.5–5.0)
ALT: 55 U/L — ABNORMAL HIGH (ref 0–44)
AST: 37 U/L (ref 15–41)
Alkaline Phosphatase: 70 U/L (ref 38–126)
Anion gap: 9 (ref 5–15)
BILIRUBIN TOTAL: 1 mg/dL (ref 0.3–1.2)
BUN: 9 mg/dL (ref 6–20)
CO2: 23 mmol/L (ref 22–32)
Calcium: 9.3 mg/dL (ref 8.9–10.3)
Chloride: 105 mmol/L (ref 98–111)
Creatinine, Ser: 0.84 mg/dL (ref 0.44–1.00)
GFR calc Af Amer: >60 mL/min (ref >60)
GFR calc non Af Amer: >60 mL/min (ref >60)
Glucose, Bld: 107 mg/dL — ABNORMAL HIGH (ref 70–99)
Potassium: 4 mmol/L (ref 3.5–5.1)
Sodium: 137 mmol/L (ref 135–145)
TOTAL PROTEIN: 7.3 g/dL (ref 6.5–8.1)

## 2018-06-23 LAB — CBC
HCT: 45.8 % (ref 36.0–46.0)
Hemoglobin: 15.2 g/dL — ABNORMAL HIGH (ref 12.0–15.0)
MCH: 30 pg (ref 26.0–34.0)
MCHC: 33.2 g/dL (ref 30.0–36.0)
MCV: 90.3 fL (ref 80.0–100.0)
Platelets: 279 10*3/uL (ref 150–400)
RBC: 5.07 MIL/uL (ref 3.87–5.11)
RDW: 11.8 % (ref 11.5–15.5)
WBC: 14.5 10*3/uL — ABNORMAL HIGH (ref 4.0–10.5)
nRBC: 0 % (ref 0.0–0.2)

## 2018-06-23 LAB — I-STAT CHEM 8, ED
BUN: 10 mg/dL (ref 6–20)
Calcium, Ion: 1.09 mmol/L — ABNORMAL LOW (ref 1.15–1.40)
Chloride: 104 mmol/L (ref 98–111)
Creatinine, Ser: 0.8 mg/dL (ref 0.44–1.00)
Glucose, Bld: 107 mg/dL — ABNORMAL HIGH (ref 70–99)
HCT: 45 % (ref 36.0–46.0)
Hemoglobin: 15.3 g/dL — ABNORMAL HIGH (ref 12.0–15.0)
Potassium: 3.8 mmol/L (ref 3.5–5.1)
Sodium: 138 mmol/L (ref 135–145)
TCO2: 24 mmol/L (ref 22–32)

## 2018-06-23 LAB — I-STAT BETA HCG BLOOD, ED (MC, WL, AP ONLY): I-stat hCG, quantitative: 197 m[IU]/mL — ABNORMAL HIGH (ref <5)

## 2018-06-23 LAB — DIFFERENTIAL
Abs Immature Granulocytes: 0.05 10*3/uL (ref 0.00–0.07)
Basophils Absolute: 0.1 10*3/uL (ref 0.0–0.1)
Basophils Relative: 1 %
Eosinophils Absolute: 0.1 10*3/uL (ref 0.0–0.5)
Eosinophils Relative: 1 %
Immature Granulocytes: 0 %
LYMPHS ABS: 4.1 10*3/uL — AB (ref 0.7–4.0)
Lymphocytes Relative: 29 %
Monocytes Absolute: 0.9 10*3/uL (ref 0.1–1.0)
Monocytes Relative: 7 %
Neutro Abs: 9.1 10*3/uL — ABNORMAL HIGH (ref 1.7–7.7)
Neutrophils Relative %: 62 %

## 2018-06-23 LAB — I-STAT TROPONIN, ED: Troponin i, poc: 0 ng/mL (ref 0.00–0.08)

## 2018-06-23 LAB — APTT: aPTT: 30 seconds (ref 24–36)

## 2018-06-23 LAB — PROTIME-INR
INR: 0.92
Prothrombin Time: 12.3 seconds (ref 11.4–15.2)

## 2018-06-23 LAB — CBG MONITORING, ED: Glucose-Capillary: 98 mg/dL (ref 70–99)

## 2018-06-23 MED ORDER — SODIUM CHLORIDE 0.9 % IV BOLUS
1000.0000 mL | Freq: Once | INTRAVENOUS | Status: AC
  Administered 2018-06-23: 1000 mL via INTRAVENOUS

## 2018-06-23 NOTE — Code Documentation (Signed)
22[facility]. Patient presented to the ED with reports of sudden onset left arm and leg weakness upon getting out of the shower at 1000. Code stroke activated. Patient to CT. CT completed. Stroke team to the bedside. NIHSS 1, see documentation for details and code stroke times. Patient reports decreased sensation on the left arm and leg on exam. Patient is too mild to treat with tPA at this time. Patient for STAT MRI. Patient transported to MRI at 1150 with ED RN and Stroke RN. Dr. Laurence SlateAroor made aware that MRI DWI is completed. Bedside handoff with Ladona Ridgelaylor, ED RN.

## 2018-06-23 NOTE — ED Provider Notes (Signed)
[facility] EMERGENCY DEPARTMENT Provider Note   CSN: [id number] Arrival date & time: 06/23/18  1125   An emergency department physician performed an initial assessment on this suspected stroke patient at 1130.  History   Chief Complaint Chief Complaint  Patient presents with  . Code Stroke    HPI Kelsey Wheeler is a 23 y.o. female.  Pt presents to the ED today with left sided weakness and tingling.  A code stroke was called upon pt's arrival.  Dr. Laurence Slate (neurology) saw pt at the bridge.  The pt said sx started suddenly around 1000 today.  The pt went for a stat CT and brain MRI.  Pt said she is feeling better now.         Past Medical History:  Diagnosis Date  . ADD (attention deficit disorder) 03/03/2014  . Hx of thyroiditis     Patient Active Problem List   Diagnosis Date Noted  . Elevated LFTs 04/13/2018  . Overweight (BMI 25.0-29.9) 04/10/2018  . Vitamin D deficiency 04/10/2018  . ADD (attention deficit disorder) 03/03/2014  . Hx of thyroiditis     History reviewed. No pertinent surgical history.   OB History   No obstetric history on file.      Home Medications    Prior to Admission medications   Medication Sig Start Date End Date Taking? Authorizing Provider  amphetamine-dextroamphetamine (ADDERALL) 15 MG tablet Take 1 tablet by mouth 2 (two) times daily. 12/04/17 12/04/18  Judd Gaudier, NP  Multiple Vitamins-Minerals (MULTIVITAMIN ADULTS PO) Take by mouth.    [provider]  Norethin Ace-Eth Estrad-FE 1-20 MG-MCG(24) CHEW TAKE 1 TABLET BY MOUTH EVERY DAY 03/27/18   Judd Gaudier, NP  Norethindrone-Ethinyl Estradiol-Fe Biphas (LO LOESTRIN FE) 1 MG-10 MCG / 10 MCG tablet Take 1 tablet by mouth daily. 03/03/18   Quentin Mulling, PA-C    Family History Family History  Problem Relation Age of Onset  . Hyperlipidemia Mother   . Hyperlipidemia Father   . Hyperlipidemia Maternal Grandmother   . Hypertension Maternal  Grandmother   . Lung cancer Maternal Grandmother   . Breast cancer Maternal Grandmother   . Lung cancer Maternal Grandfather   . Diabetes Paternal Grandfather   . Heart disease Paternal Grandfather   . Hyperlipidemia Paternal Grandfather   . Heart failure Paternal Grandfather   . Diabetes Paternal Grandmother   . Atrial fibrillation Paternal Grandmother     Social History Social History   Tobacco Use  . Smoking status: Never Smoker  . Smokeless tobacco: Never Used  Substance Use Topics  . Alcohol use: Not Currently  . Drug use: Never     Allergies   Patient has no known allergies.   Review of Systems Review of Systems  Neurological: Positive for weakness and numbness.  All other systems reviewed and are negative.    Physical Exam Updated Vital Signs BP 118/70   Pulse 82   Temp 97.9 F (36.6 C) (Oral)   Resp 18   Wt 80.3 kg   SpO2 100%   BMI 29.92 kg/m   Physical Exam Vitals signs and nursing note reviewed.  Constitutional:      Appearance: Normal appearance.  HENT:     Head: Normocephalic and atraumatic.     Right Ear: External ear normal.     Left Ear: External ear normal.     Nose: Nose normal.     Mouth/Throat:     Mouth: Mucous membranes are moist.  Pharynx: Oropharynx is clear.  Eyes:     Pupils: Pupils are equal, round, and reactive to light.  Neck:     Musculoskeletal: Normal range of motion and neck supple.  Cardiovascular:     Rate and Rhythm: Regular rhythm. Tachycardia present.     Pulses: Normal pulses.     Heart sounds: Normal heart sounds.  Pulmonary:     Effort: Pulmonary effort is normal.  Abdominal:     General: Abdomen is flat.  Musculoskeletal: Normal range of motion.  Skin:    General: Skin is warm.     Capillary Refill: Capillary refill takes less than 2 seconds.  Neurological:     General: No focal deficit present.     Mental Status: She is alert and oriented to person, place, and time.  Psychiatric:        Mood  and Affect: Mood normal.        Behavior: Behavior normal.      ED Treatments / Results  Labs (all labs ordered are listed, but only abnormal results are displayed) Labs Reviewed  CBC - Abnormal; Notable for the following components:      Result Value   WBC 14.5 (*)    Hemoglobin 15.2 (*)    All other components within normal limits  DIFFERENTIAL - Abnormal; Notable for the following components:   Neutro Abs 9.1 (*)    Lymphs Abs 4.1 (*)    All other components within normal limits  COMPREHENSIVE METABOLIC PANEL - Abnormal; Notable for the following components:   Glucose, Bld 107 (*)    ALT 55 (*)    All other components within normal limits  I-STAT CHEM 8, ED - Abnormal; Notable for the following components:   Glucose, Bld 107 (*)    Calcium, Ion 1.09 (*)    Hemoglobin 15.3 (*)    All other components within normal limits  I-STAT BETA HCG BLOOD, ED (MC, WL, AP ONLY) - Abnormal; Notable for the following components:   I-stat hCG, quantitative 197.0 (*)    All other components within normal limits  PROTIME-INR  APTT  I-STAT TROPONIN, ED  CBG MONITORING, ED    EKG EKG Interpretation  Date/Time:  Friday June 23 2018 12:34:51 EST Ventricular Rate:  91 PR Interval:    QRS Duration: 76 QT Interval:  343 QTC Calculation: 422 R Axis:   27 Text Interpretation:  Sinus rhythm Low voltage, precordial leads No old tracing to compare Confirmed by Jacalyn LefevreHaviland, Yareni Creps [phone]([id number]) on 06/23/2018 1:25:07 PM   Radiology Mr Brain Wo Contrast  Result Date: 06/23/2018 CLINICAL DATA:  Left-sided numbness and weakness involving the arm and leg. EXAM: MRI HEAD WITHOUT CONTRAST TECHNIQUE: Multiplanar, multiecho pulse sequences of the brain and surrounding structures were obtained without intravenous contrast. COMPARISON:  Head CT 06/23/2018 FINDINGS: Brain: There is no evidence of acute infarct, intracranial hemorrhage, mass, midline shift, or extra-axial fluid collection. The ventricles and  sulci are normal. The brain is normal in signal. Vascular: Major intracranial vascular flow voids are preserved. Skull and upper cervical spine: Unremarkable bone marrow signal. Sinuses/Orbits: Unremarkable orbits. Small right maxillary sinus mucous retention cyst. Clear mastoid air cells. Other: None. IMPRESSION: Unremarkable appearance of the brain. Electronically Signed   By: Sebastian AcheAllen  Grady M.D.   On: 06/23/2018 12:42   Ct Head Code Stroke Wo Contrast  Result Date: 06/23/2018 CLINICAL DATA:  Code stroke.  Left arm weakness EXAM: CT HEAD WITHOUT CONTRAST TECHNIQUE: Contiguous axial images were obtained from the  base of the skull through the vertex without intravenous contrast. COMPARISON:  None. FINDINGS: Brain: No evidence of acute infarction, hemorrhage, hydrocephalus, extra-axial collection or mass lesion/mass effect. Vascular: No hyperdense vessel or unexpected calcification. Skull: Normal. Negative for fracture or focal lesion. Sinuses/Orbits: No acute finding. Other: These results were communicated to Dr. Laurence SlateAroor at 11:42 amon 1/10/2020by text page via the [facility] messaging system. ASPECTS [facility]([place] Stroke Program Early CT Score) - Ganglionic level infarction (caudate, lentiform nuclei, internal capsule, insula, M1-M3 cortex): 7 - Supraganglionic infarction (M4-M6 cortex): 3 Total score (0-10 with 10 being normal): 10 IMPRESSION: Normal head CT.  ASPECTS is 10. Electronically Signed   By: Marnee SpringJonathon  Watts M.D.   On: 06/23/2018 11:43    Procedures Procedures (including critical care time)  Medications Ordered in ED Medications  sodium chloride 0.9 % bolus 1,000 mL (1,000 mLs Intravenous New Bag/Given 06/23/18 1301)     Initial Impression / Assessment and Plan / ED Course  I have reviewed the triage vital signs and the nursing notes.  Pertinent labs & imaging results that were available during my care of the patient were reviewed by me and considered in my medical decision making (see chart for  details).    Pt's MRI was normal.  No evidence of CVA.  Pt does have a positive HCG quant.  She is instructed to get that rechecked in 2 days.  She is feeling back to normal now.  Her HR is down after IVFs.  She is instructed to return if worse. F/u with pcp/women's clinic.  Final Clinical Impressions(s) / ED Diagnoses   Final diagnoses:  Numbness  Positive pregnancy test    ED Discharge Orders    None       Jacalyn LefevreHaviland, Ralpheal Zappone, MD 06/23/18 1407

## 2018-06-23 NOTE — ED Notes (Signed)
ED Provider at bedside. 

## 2018-06-23 NOTE — Consult Note (Signed)
Requesting Physician: Dr. Particia Nearinghaviland    Chief Complaint: Left side weakness  History obtained from: Patient and Chart     HPI:                                                                                                                                       Kelsey Wheeler is an 23 y.o. female with PMH of ADD presents to the ER with sudden onset left side weakness when getting out of the shower at 10 am. She felt her left side was weaker and came to the ER and at triage stroke alert was initiated.    Assessment patient appears to have equal strength in all 4 extremities no drift.  Patient underwent stat CT head which was negative for any acute abnormality.  TPA  was not administered as symptoms were too mild and low suspicion this was a stroke  Denies any history of headache or migraine disorder  Date last known well: 1.10.20 Time last known well: 10 am tPA Given: no, low suspicion for stroke and mild/non disabling symptoms  NIHSS: 1 Baseline MRS 0    Past Medical History:  Diagnosis Date  . ADD (attention deficit disorder) 03/03/2014  . Hx of thyroiditis     History reviewed. No pertinent surgical history.  Family History  Problem Relation Age of Onset  . Hyperlipidemia Mother   . Hyperlipidemia Father   . Hyperlipidemia Maternal Grandmother   . Hypertension Maternal Grandmother   . Lung cancer Maternal Grandmother   . Breast cancer Maternal Grandmother   . Lung cancer Maternal Grandfather   . Diabetes Paternal Grandfather   . Heart disease Paternal Grandfather   . Hyperlipidemia Paternal Grandfather   . Heart failure Paternal Grandfather   . Diabetes Paternal Grandmother   . Atrial fibrillation Paternal Grandmother    Social History:  reports that she has never smoked. She has never used smokeless tobacco. She reports previous alcohol use. She reports that she does not use drugs.  Allergies: No Known Allergies  Medications:                                                                                                                         I reviewed home medications   ROS:  14 systems reviewed and negative except above    Examination:                                                                                                      General: Appears well-developed  Psych: Affect appropriate to situation Eyes: No scleral injection HENT: No OP obstrucion Head: Normocephalic.  Cardiovascular: Normal rate and regular rhythm.  Respiratory: Effort normal and breath sounds normal to anterior ascultation GI: Soft.  No distension. There is no tenderness.  Skin: WDI    Neurological Examination Mental Status: Alert, oriented, thought content appropriate.  Speech fluent without evidence of aphasia. Able to follow 3 step commands without difficulty. Cranial Nerves: II: Visual fields grossly normal,  III,IV, VI: ptosis not present, extra-ocular motions intact bilaterally, pupils equal, round, reactive to light and accommodation V,VII: smile symmetric, facial light touch sensation reduced on left side VIII: hearing normal bilaterally IX,X: uvula rises symmetrically XI: bilateral shoulder shrug XII: midline tongue extension Motor: Right : Upper extremity   5/5    Left:     Upper extremity   5/5  Lower extremity   5/5     Lower extremity   5/5 Tone and bulk:normal tone throughout; no atrophy noted Sensory: reduced sensation to light touch, temp on left face, arm and leg Deep Tendon Reflexes: 2+ and symmetric throughout Plantars: Right: downgoing   Left: downgoing Cerebellar: normal finger-to-nose, normal rapid alternating movements and normal heel-to-shin test Gait: normal gait and station     Lab Results: Basic Metabolic Panel: Recent Labs  Lab 06/23/18 1131 06/23/18 1138  NA 137 138  K 4.0 3.8  CL 105 104  CO2  23  --   GLUCOSE 107* 107*  BUN 9 10  CREATININE 0.84 0.80  CALCIUM 9.3  --     CBC: Recent Labs  Lab 06/23/18 1131 06/23/18 1138  WBC 14.5*  --   NEUTROABS 9.1*  --   HGB 15.2* 15.3*  HCT 45.8 45.0  MCV 90.3  --   PLT 279  --     Coagulation Studies: Recent Labs    06/23/18 1131  LABPROT 12.3  INR 0.92    Imaging: Mr Brain Wo Contrast  Result Date: 06/23/2018 CLINICAL DATA:  Left-sided numbness and weakness involving the arm and leg. EXAM: MRI HEAD WITHOUT CONTRAST TECHNIQUE: Multiplanar, multiecho pulse sequences of the brain and surrounding structures were obtained without intravenous contrast. COMPARISON:  Head CT 06/23/2018 FINDINGS: Brain: There is no evidence of acute infarct, intracranial hemorrhage, mass, midline shift, or extra-axial fluid collection. The ventricles and sulci are normal. The brain is normal in signal. Vascular: Major intracranial vascular flow voids are preserved. Skull and upper cervical spine: Unremarkable bone marrow signal. Sinuses/Orbits: Unremarkable orbits. Small right maxillary sinus mucous retention cyst. Clear mastoid air cells. Other: None. IMPRESSION: Unremarkable appearance of the brain. Electronically Signed   By: Sebastian AcheAllen  Grady M.D.   On: 06/23/2018 12:42   Ct Head Code Stroke Wo Contrast  Result Date: 06/23/2018 CLINICAL DATA:  Code stroke.  Left arm weakness EXAM: CT HEAD  WITHOUT CONTRAST TECHNIQUE: Contiguous axial images were obtained from the base of the skull through the vertex without intravenous contrast. COMPARISON:  None. FINDINGS: Brain: No evidence of acute infarction, hemorrhage, hydrocephalus, extra-axial collection or mass lesion/mass effect. Vascular: No hyperdense vessel or unexpected calcification. Skull: Normal. Negative for fracture or focal lesion. Sinuses/Orbits: No acute finding. Other: These results were communicated to Dr. Laurence Slate at 11:42 amon 1/10/2020by text page via the [facility] messaging system. ASPECTS [facility]  Stroke Program Early CT Score) - Ganglionic level infarction (caudate, lentiform nuclei, internal capsule, insula, M1-M3 cortex): 7 - Supraganglionic infarction (M4-M6 cortex): 3 Total score (0-10 with 10 being normal): 10 IMPRESSION: Normal head CT.  ASPECTS is 10. Electronically Signed   By: Marnee Spring M.D.   On: 06/23/2018 11:43     ASSESSMENT AND PLAN   23 y.o. female with PMH of ADD presents to the ER with sudden onset left side weakness when getting out of the shower at 10 am.   Left side numbness/subjective weakness ?  complex migraine   - Stat CT head unremarkable - MRI Brain w/o appears normal   Patient can be discharged, if recurs follow up with Neurology   Georgiana Spinner  [facility] Pager Number [phone]

## 2018-06-23 NOTE — ED Notes (Signed)
Pt transported to MRI with this RN. 

## 2018-06-23 NOTE — Discharge Instructions (Addendum)
You will need to get your pregnancy hormone level (HCG quant) rechecked in 2 days.

## 2018-06-23 NOTE — ED Triage Notes (Signed)
Pt arrived c/o left sided numbness and weakness that started at 10am today, states her left arm feels heavier and reports sensation changes, also heaviness to her left leg, denies gait changes, denies facial involvement, denies headache or vision changes, denies history of same, grip strength equal on assessment Md Steinl evaluated pt on arrival and code stroke activated.

## 2018-06-23 NOTE — ED Notes (Signed)
Neuro at bedside.

## 2018-06-27 ENCOUNTER — Other Ambulatory Visit: Payer: Self-pay | Admitting: Obstetrics and Gynecology

## 2018-08-08 LAB — OB RESULTS CONSOLE ABO/RH: RH Type: NEGATIVE

## 2018-08-08 LAB — OB RESULTS CONSOLE GC/CHLAMYDIA
Chlamydia: NEGATIVE
Gonorrhea: NEGATIVE

## 2018-08-08 LAB — OB RESULTS CONSOLE HEPATITIS B SURFACE ANTIGEN: Hepatitis B Surface Ag: NEGATIVE

## 2018-08-08 LAB — OB RESULTS CONSOLE HIV ANTIBODY (ROUTINE TESTING): HIV: NONREACTIVE

## 2018-08-08 LAB — OB RESULTS CONSOLE RUBELLA ANTIBODY, IGM: Rubella: IMMUNE

## 2018-08-08 LAB — OB RESULTS CONSOLE RPR: RPR: NONREACTIVE

## 2018-08-08 LAB — OB RESULTS CONSOLE ANTIBODY SCREEN: Antibody Screen: NEGATIVE

## 2018-12-19 ENCOUNTER — Encounter: Payer: No Typology Code available for payment source | Admitting: Adult Health Nurse Practitioner

## 2018-12-25 NOTE — Progress Notes (Signed)
Complete Physical  Assessment and Plan: Health Maintenance- Discussed STD testing, safe sex, alcohol and drug awareness, drinking and driving dangers, wearing a seat belt and general safety measures for young adult.  Diagnoses and all orders for this visit:  Kelsey Wheeler was seen today for annual exam.  Diagnoses and all orders for this visit:  Encounter for annual physical exam Yearly  Pregnancy with 30 completed weeks gestation Follows with Dr Ronita Hipps Discussed general care include diet and exercise Discussed water intake and importance Increase walking  Attention deficit disorder (ADD) without hyperactivity No medication, used while in school Doing well with this  Vitamin D deficiency  Discussed supplementation  Screening for blood or protein in urine -     Urinalysis w microscopic + reflex cultur  BMI 34.0-34.9,adult During pregnancy  Medication management -     Urinalysis w microscopic + reflex cultur     Discussed med's effects and SE's. Screening labs and tests as requested with regular follow-up as recommended. Over 40 minutes of exam, counseling, chart review and critical decision making was performed  HPI  This very nice 23 y.o.female presents for complete physical. She is G1P0, 30weeks.  She follows with [facility], Dr Ronita Hipps.  She is O- and had one injection of rhogam and next due after delivery  Fathers blood type is unknown and he is not involved with the pregnancy.  Reports she has support of family and friends. Her next appointment is in two weeks.  She has labs drawn at rountin appointments and reports no health concerns at this time.  She has history of anemia and vitamin D deficiency and ADD and not taking any medication for this.  Reports she mainly utilized this while she was in school.  She does not workout.    Finally, patient has history of Vitamin D Deficiency and last vitamin D was  Lab Results  Component Value Date   VD25OH 18 (L) 04/12/2018   .  Currently on supplementation  Current Medications:  Current Outpatient Medications on File Prior to Visit  Medication Sig Dispense Refill  . amphetamine-dextroamphetamine (ADDERALL) 15 MG tablet Take 1 tablet by mouth 2 (two) times daily. 60 tablet 0  . Multiple Vitamins-Minerals (MULTIVITAMIN ADULTS PO) Take by mouth.    . Norethin Ace-Eth Estrad-FE 1-20 MG-MCG(24) CHEW TAKE 1 TABLET BY MOUTH EVERY DAY 84 tablet 0  . Norethindrone-Ethinyl Estradiol-Fe Biphas (LO LOESTRIN FE) 1 MG-10 MCG / 10 MCG tablet Take 1 tablet by mouth daily. 3 Package 1   No current facility-administered medications on file prior to visit.    Health Maintenance:   Immunization History  Administered Date(s) Administered  . HPV Quadrivalent 03/11/2008, 05/17/2008, 09/18/2008  . Hepatitis B 12/06/2011  . Influenza Inj Mdck Quad With Preservative 03/15/2017, 02/23/2018  . Influenza Split 03/29/2013, 03/12/2014  . Influenza,inj,quad, With Preservative 03/16/2016  . PPD Test 11/15/2014, 11/05/2015, 12/17/2016  . Tdap 09/07/2011    TD/TDAP: 2013 Influenza: 2019, due for 2020 Pneumovax: N/A Prevnar 13: N/A HPV vaccines: Complete 2010  LMP: Pregnant Sexually Active: No, STD testing offered, deferred. Pap: 2019 MGM: N/A  Pregnancy Screenings: HIV screening - Non-reactive RPR: Non-reactive Hep B : Negative Rubella: Immune   Allergies: No Known Allergies Medical History:  has Hx of thyroiditis; ADD (attention deficit disorder); Overweight (BMI 25.0-29.9); Vitamin D deficiency; and Elevated LFTs on their problem list. Surgical History:  She  has no past surgical history on file. Family History:  Her family history includes Atrial fibrillation in her  paternal grandmother; Breast cancer in her maternal grandmother; Diabetes in her paternal grandfather and paternal grandmother; Heart disease in her paternal grandfather; Heart failure in her paternal grandfather; Hyperlipidemia in her father, maternal  grandmother, mother, and paternal grandfather; Hypertension in her maternal grandmother; Lung cancer in her maternal grandfather and maternal grandmother. Social History:   reports that she has never smoked. She has never used smokeless tobacco. She reports previous alcohol use. She reports that she does not use drugs.  Review of Systems: Review of Systems  Constitutional: Negative for chills, diaphoresis, fever, malaise/fatigue and weight loss.  HENT: Negative for congestion, ear discharge, ear pain, hearing loss, nosebleeds, sinus pain, sore throat and tinnitus.   Eyes: Negative for blurred vision, double vision, photophobia, pain, discharge and redness.  Respiratory: Negative for cough, hemoptysis, sputum production, shortness of breath, wheezing and stridor.   Cardiovascular: Negative for chest pain, palpitations, orthopnea, claudication, leg swelling and PND.  Gastrointestinal: Negative for abdominal pain, blood in stool, constipation, diarrhea, heartburn, melena, nausea and vomiting.  Genitourinary: Negative for dysuria, flank pain, frequency, hematuria and urgency.  Musculoskeletal: Negative for back pain, falls, joint pain, myalgias and neck pain.  Skin: Negative for itching and rash.  Neurological: Negative for dizziness, tingling, tremors, sensory change, speech change, focal weakness, seizures, loss of consciousness, weakness and headaches.  Endo/Heme/Allergies: Negative for environmental allergies and polydipsia. Does not bruise/bleed easily.  Psychiatric/Behavioral: Negative for depression, hallucinations, memory loss, substance abuse and suicidal ideas. The patient is not nervous/anxious and does not have insomnia.     Physical Exam: Estimated body mass index is 29.92 kg/m as calculated from the following:   Height as of 04/12/18: 5' 4.5" (1.638 m).   Weight as of 06/23/18: 177 lb 0.5 oz (80.3 kg). There were no vitals taken for this visit. General Appearance: Well nourished,  in no apparent distress.  Eyes: PERRLA, EOMs, conjunctiva no swelling or erythema, normal fundi and vessels.  Sinuses: No Frontal/maxillary tenderness  ENT/Mouth: Ext aud canals clear, normal light reflex with TMs without erythema, bulging. Good dentition. No erythema, swelling, or exudate on post pharynx. Tonsils not swollen or erythematous. Hearing normal.  Neck: Supple, thyroid normal. No bruits  Respiratory: Respiratory effort normal, BS equal bilaterally without rales, rhonchi, wheezing or stridor.  Cardio: RRR without murmurs, rubs or gallops. Brisk peripheral pulses without edema.  Chest: symmetric, with normal excursions and percussion.  Abdomen: nontender, no guarding, rebound, hernias, masses, or UTA organomegaly related to pregnancy. Lymphatics: Non tender without lymphadenopathy.  Genitourinary: defer Musculoskeletal: Full ROM all peripheral extremities,5/5 strength, and normal gait.  Skin: Warm, dry without rashes, lesions, ecchymosis. Neuro: Cranial nerves intact, reflexes equal bilaterally. Normal muscle tone, no cerebellar symptoms. Sensation intact.  Psych: Awake and oriented X 3, normal affect, Insight and Judgment appropriate.   EKG: defer  Kelsey Wheeler 9:30 AM [facility]

## 2018-12-26 ENCOUNTER — Other Ambulatory Visit: Payer: Self-pay

## 2018-12-26 ENCOUNTER — Ambulatory Visit (INDEPENDENT_AMBULATORY_CARE_PROVIDER_SITE_OTHER): Payer: No Typology Code available for payment source | Admitting: Adult Health Nurse Practitioner

## 2018-12-26 ENCOUNTER — Encounter: Payer: Self-pay | Admitting: Adult Health Nurse Practitioner

## 2018-12-26 VITALS — BP 124/82 | Temp 97.7°F | Ht 64.5 in | Wt 203.6 lb

## 2018-12-26 DIAGNOSIS — Z0000 Encounter for general adult medical examination without abnormal findings: Secondary | ICD-10-CM | POA: Diagnosis not present

## 2018-12-26 DIAGNOSIS — Z3A30 30 weeks gestation of pregnancy: Secondary | ICD-10-CM

## 2018-12-26 DIAGNOSIS — Z79899 Other long term (current) drug therapy: Secondary | ICD-10-CM

## 2018-12-26 DIAGNOSIS — Z1389 Encounter for screening for other disorder: Secondary | ICD-10-CM

## 2018-12-26 DIAGNOSIS — E559 Vitamin D deficiency, unspecified: Secondary | ICD-10-CM

## 2018-12-26 DIAGNOSIS — Z6834 Body mass index (BMI) 34.0-34.9, adult: Secondary | ICD-10-CM

## 2018-12-26 DIAGNOSIS — F988 Other specified behavioral and emotional disorders with onset usually occurring in childhood and adolescence: Secondary | ICD-10-CM

## 2018-12-26 NOTE — Patient Instructions (Signed)
  Check you Prenatal vitamin.  You should be taking at lesat 5,000IU, 250mg .  Take one daily.    VITAMIN D IS IMPORTANT  Vitamin D goal is between 70-100  Please make sure that you are taking your Vitamin D as directed.   It is very important as a natural anti-inflammatory   helping hair, skin, and nails, as well as reducing stroke and heart attack risk.   It helps your bones and helps with mood.  We want you on at least 5000 IU daily  It also decreases numerous cancer risks so please take it as directed.   Low Vit D is associated with a 200-300% higher risk for CANCER   and 200-300% higher risk for HEART   ATTACK  &  STROKE.    .....................................Marland Kitchen  It is also associated with higher death rate at younger ages,   autoimmune diseases like Rheumatoid arthritis, Lupus, Multiple Sclerosis.     Also many other serious conditions, like depression, Alzheimer's  Dementia, infertility, muscle aches, fatigue, fibromyalgia - just to name a few.  +++++++++++++++++++  Can get liquid vitamin D from [facility] here in [place] at   [facility] alternatives [address] [place], [place] [address]

## 2018-12-28 LAB — URINALYSIS W MICROSCOPIC + REFLEX CULTURE
Bilirubin Urine: NEGATIVE
Hgb urine dipstick: NEGATIVE
Hyaline Cast: NONE SEEN /LPF
Ketones, ur: NEGATIVE
Nitrites, Initial: NEGATIVE
Protein, ur: NEGATIVE
Specific Gravity, Urine: 1.016 (ref 1.001–1.03)
pH: 7 (ref 5.0–8.0)

## 2018-12-28 LAB — URINE CULTURE
MICRO NUMBER:: 669368
Result:: NO GROWTH
SPECIMEN QUALITY:: ADEQUATE

## 2018-12-28 LAB — CULTURE INDICATED

## 2018-12-31 ENCOUNTER — Encounter: Payer: Self-pay | Admitting: Adult Health Nurse Practitioner

## 2019-02-08 LAB — OB RESULTS CONSOLE GBS: GBS: NEGATIVE

## 2019-02-28 ENCOUNTER — Encounter (HOSPITAL_COMMUNITY): Payer: Self-pay | Admitting: *Deleted

## 2019-02-28 ENCOUNTER — Other Ambulatory Visit (HOSPITAL_COMMUNITY)
Admission: RE | Admit: 2019-02-28 | Discharge: 2019-02-28 | Disposition: A | Discharge status: Home / Self Care | Payer: No Typology Code available for payment source | Source: Ambulatory Visit | Attending: Obstetrics and Gynecology | Admitting: Obstetrics and Gynecology

## 2019-02-28 ENCOUNTER — Other Ambulatory Visit: Payer: Self-pay | Admitting: Obstetrics and Gynecology

## 2019-02-28 ENCOUNTER — Telehealth (HOSPITAL_COMMUNITY): Payer: Self-pay | Admitting: *Deleted

## 2019-02-28 ENCOUNTER — Other Ambulatory Visit: Payer: Self-pay

## 2019-02-28 DIAGNOSIS — Z01812 Encounter for preprocedural laboratory examination: Secondary | ICD-10-CM | POA: Insufficient documentation

## 2019-02-28 DIAGNOSIS — Z20828 Contact with and (suspected) exposure to other viral communicable diseases: Secondary | ICD-10-CM | POA: Insufficient documentation

## 2019-02-28 LAB — SARS CORONAVIRUS 2 BY RT PCR (HOSPITAL ORDER, PERFORMED IN ~~LOC~~ HOSPITAL LAB): SARS Coronavirus 2: NEGATIVE

## 2019-02-28 NOTE — MAU Note (Signed)
Covid swab collected. PT tolerated well.Pt asymptomatic 

## 2019-02-28 NOTE — Telephone Encounter (Signed)
Preadmission screen  

## 2019-03-01 ENCOUNTER — Encounter (HOSPITAL_COMMUNITY): Payer: Self-pay

## 2019-03-01 ENCOUNTER — Inpatient Hospital Stay (HOSPITAL_COMMUNITY): Payer: No Typology Code available for payment source

## 2019-03-01 ENCOUNTER — Inpatient Hospital Stay (HOSPITAL_COMMUNITY)
Admission: AD | Admit: 2019-03-01 | Discharge: 2019-03-04 | DRG: 807 | Disposition: A | Discharge status: Home / Self Care | Payer: No Typology Code available for payment source | Attending: Obstetrics and Gynecology | Admitting: Obstetrics and Gynecology

## 2019-03-01 DIAGNOSIS — Z20828 Contact with and (suspected) exposure to other viral communicable diseases: Secondary | ICD-10-CM | POA: Diagnosis present

## 2019-03-01 DIAGNOSIS — O403XX0 Polyhydramnios, third trimester, not applicable or unspecified: Secondary | ICD-10-CM | POA: Diagnosis present

## 2019-03-01 DIAGNOSIS — O9902 Anemia complicating childbirth: Secondary | ICD-10-CM | POA: Diagnosis present

## 2019-03-01 DIAGNOSIS — Z3A39 39 weeks gestation of pregnancy: Secondary | ICD-10-CM | POA: Diagnosis not present

## 2019-03-01 DIAGNOSIS — D649 Anemia, unspecified: Secondary | ICD-10-CM | POA: Diagnosis present

## 2019-03-01 DIAGNOSIS — Z3490 Encounter for supervision of normal pregnancy, unspecified, unspecified trimester: Secondary | ICD-10-CM | POA: Diagnosis present

## 2019-03-01 LAB — CBC
HCT: 39.1 % (ref 36.0–46.0)
Hemoglobin: 13 g/dL (ref 12.0–15.0)
MCH: 30.2 pg (ref 26.0–34.0)
MCHC: 33.2 g/dL (ref 30.0–36.0)
MCV: 90.7 fL (ref 80.0–100.0)
Platelets: 235 10*3/uL (ref 150–400)
RBC: 4.31 MIL/uL (ref 3.87–5.11)
RDW: 13.6 % (ref 11.5–15.5)
WBC: 14.7 10*3/uL — ABNORMAL HIGH (ref 4.0–10.5)
nRBC: 0 % (ref 0.0–0.2)

## 2019-03-01 LAB — RPR: RPR Ser Ql: NONREACTIVE

## 2019-03-01 MED ORDER — OXYTOCIN 40 UNITS IN NORMAL SALINE INFUSION - SIMPLE MED
2.5000 [IU]/h | INTRAVENOUS | Status: DC
  Filled 2019-03-01: qty 1000

## 2019-03-01 MED ORDER — OXYTOCIN BOLUS FROM INFUSION
500.0000 mL | Freq: Once | INTRAVENOUS | Status: AC
  Administered 2019-03-02: 12:00:00 500 mL via INTRAVENOUS

## 2019-03-01 MED ORDER — OXYTOCIN 40 UNITS IN NORMAL SALINE INFUSION - SIMPLE MED
1.0000 m[IU]/min | INTRAVENOUS | Status: DC
  Administered 2019-03-01: 19:00:00 2 m[IU]/min via INTRAVENOUS

## 2019-03-01 MED ORDER — LACTATED RINGERS IV SOLN
500.0000 mL | INTRAVENOUS | Status: DC | PRN
  Administered 2019-03-02: 500 mL via INTRAVENOUS

## 2019-03-01 MED ORDER — TERBUTALINE SULFATE 1 MG/ML IJ SOLN: 0.2500 mg | Freq: Once | INTRAMUSCULAR | Status: DC | PRN

## 2019-03-01 MED ORDER — MISOPROSTOL 25 MCG QUARTER TABLET
25.0000 ug | ORAL_TABLET | ORAL | Status: DC | PRN
  Administered 2019-03-01 (×2): 25 ug via VAGINAL
  Filled 2019-03-01 (×2): qty 1

## 2019-03-01 MED ORDER — SOD CITRATE-CITRIC ACID 500-334 MG/5ML PO SOLN: 30.0000 mL | ORAL | Status: DC | PRN

## 2019-03-01 MED ORDER — ACETAMINOPHEN 325 MG PO TABS: 650.0000 mg | ORAL_TABLET | ORAL | Status: DC | PRN

## 2019-03-01 MED ORDER — LACTATED RINGERS IV SOLN
INTRAVENOUS | Status: DC
  Administered 2019-03-01 (×2): via INTRAVENOUS
  Administered 2019-03-01: 1000 mL via INTRAVENOUS
  Administered 2019-03-02 (×2): via INTRAVENOUS

## 2019-03-01 MED ORDER — LIDOCAINE HCL (PF) 1 % IJ SOLN: 30.0000 mL | INTRAMUSCULAR | Status: DC | PRN

## 2019-03-01 MED ORDER — ONDANSETRON HCL 4 MG/2ML IJ SOLN
4.0000 mg | Freq: Four times a day (QID) | INTRAMUSCULAR | Status: DC | PRN
  Administered 2019-03-02 (×2): 4 mg via INTRAVENOUS
  Filled 2019-03-01 (×2): qty 2

## 2019-03-01 NOTE — Progress Notes (Signed)
Avannah Decker is a 23 y.o. G1P0 at [redacted]w[redacted]d by LMP admitted for induction of labor due to Hydramnios.  Subjective: Crampy  Objective: BP 114/62   Pulse 94   Temp 98.1 F (36.7 C) (Oral)   Resp 18   Ht 5' 4.5" (1.638 m)   Wt 103.4 kg   LMP  (LMP Unknown)   BMI 38.53 kg/m  No intake/output data recorded. No intake/output data recorded.  FHT:  FHR: 145 bpm, variability: moderate,  accelerations:  Present,  decelerations:  Absent UC:   irregular, every 2-5 minutes SVE:   Dilation: 3 Effacement (%): 60 Station: -3 Exam by:: Dr. Ronita Hipps  Cervical foley placed without difficulty  Labs: Lab Results  Component Value Date   WBC 14.7 (H) 03/01/2019   HGB 13.0 03/01/2019   HCT 39.1 03/01/2019   MCV 90.7 03/01/2019   PLT 235 03/01/2019    Assessment / Plan: IOL for polyhydrmamios- foley placed  Labor: Progressing normally Preeclampsia:  no signs or symptoms of toxicity Fetal Wellbeing:  Category I Pain Control:  Labor support without medications I/D:  n/a Anticipated MOD:  NSVD  Thermon Zulauf J 03/01/2019, 9:14 PM

## 2019-03-01 NOTE — H&P (Signed)
Kelsey Wheeler is a 23 y.o. female presenting for IOL for polyhydramnios. OB History    Gravida  1   Para      Term      Preterm      AB      Living        SAB      TAB      Ectopic      Multiple      Live Births             Past Medical History:  Diagnosis Date  . ADD (attention deficit disorder) 03/03/2014  . Hx of thyroiditis    Past Surgical History:  Procedure Laterality Date  . WISDOM TOOTH EXTRACTION     Family History: family history includes Atrial fibrillation in her paternal grandmother; Breast cancer in her maternal grandmother; Diabetes in her paternal grandfather and paternal grandmother; Heart disease in her paternal grandfather; Heart failure in her paternal grandfather; Hyperlipidemia in her father, maternal grandmother, mother, and paternal grandfather; Hypertension in her maternal grandmother; Lung cancer in her maternal grandfather and maternal grandmother. Social History:  reports that she has never smoked. She has never used smokeless tobacco. She reports previous alcohol use. She reports that she does not use drugs.     Maternal Diabetes: No Genetic Screening: Normal Maternal Ultrasounds/Referrals: Other: polyhydramnios Fetal Ultrasounds or other Referrals:  None Maternal Substance Abuse:  No Significant Maternal Medications:  None Significant Maternal Lab Results:  Group B Strep negative Other Comments:  None  Review of Systems  Constitutional: Negative.   All other systems reviewed and are negative.  Maternal Medical History:  Contractions: Frequency: rare.   Perceived severity is mild.    Fetal activity: Perceived fetal activity is normal.   Last perceived fetal movement was within the past hour.    Prenatal complications: Polyhydramnios.   Prenatal Complications - Diabetes: none.      There were no vitals taken for this visit. Maternal Exam:  Uterine Assessment: Contraction strength is mild.  Contraction frequency is rare.    Abdomen: Patient reports no abdominal tenderness. Fetal presentation: vertex  Introitus: Normal vulva. Normal vagina.  Ferning test: not done.  Nitrazine test: not done. Amniotic fluid character: not assessed.  Pelvis: questionable for delivery.   Cervix: Cervix evaluated by digital exam.     Physical Exam  Nursing note and vitals reviewed. Constitutional: She is oriented to person, place, and time. She appears well-developed and well-nourished.  HENT:  Head: Normocephalic and atraumatic.  Neck: Normal range of motion. Neck supple.  Cardiovascular: Normal rate and regular rhythm.  Respiratory: Effort normal and breath sounds normal.  GI: Soft. Bowel sounds are normal.  Genitourinary:    Vulva, vagina and uterus normal.   Musculoskeletal: Normal range of motion.  Neurological: She is alert and oriented to person, place, and time. She has normal reflexes.  Skin: Skin is warm and dry.  Psychiatric: She has a normal mood and affect.    Prenatal labs: ABO, Rh: O/Negative/-- (02/25 0000) Antibody: Negative (02/25 0000) Rubella: Immune (02/25 0000) RPR: Nonreactive (02/25 0000)  HBsAg: Negative (02/25 0000)  HIV: Non-reactive (02/25 0000)  GBS: Negative/-- (08/27 0000)   Assessment/Plan: Polyhydramnios at 39 weeks IOL   Ganesh Deeg J 03/01/2019, 6:52 AM

## 2019-03-02 ENCOUNTER — Inpatient Hospital Stay (HOSPITAL_COMMUNITY): Payer: No Typology Code available for payment source | Admitting: Anesthesiology

## 2019-03-02 ENCOUNTER — Encounter (HOSPITAL_COMMUNITY): Payer: Self-pay | Admitting: General Practice

## 2019-03-02 MED ORDER — SIMETHICONE 80 MG PO CHEW: 80.0000 mg | CHEWABLE_TABLET | ORAL | Status: DC | PRN

## 2019-03-02 MED ORDER — EPHEDRINE 5 MG/ML INJ: 10.0000 mg | INTRAVENOUS | Status: DC | PRN

## 2019-03-02 MED ORDER — PHENYLEPHRINE 40 MCG/ML (10ML) SYRINGE FOR IV PUSH (FOR BLOOD PRESSURE SUPPORT): 80.0000 ug | PREFILLED_SYRINGE | INTRAVENOUS | Status: DC | PRN

## 2019-03-02 MED ORDER — LIDOCAINE HCL (PF) 1 % IJ SOLN
INTRAMUSCULAR | Status: DC | PRN
  Administered 2019-03-02 (×2): 4 mL via EPIDURAL

## 2019-03-02 MED ORDER — METHYLERGONOVINE MALEATE 0.2 MG/ML IJ SOLN: 0.2000 mg | INTRAMUSCULAR | Status: DC | PRN

## 2019-03-02 MED ORDER — OXYCODONE-ACETAMINOPHEN 5-325 MG PO TABS: 1.0000 | ORAL_TABLET | ORAL | Status: DC | PRN

## 2019-03-02 MED ORDER — PRENATAL MULTIVITAMIN CH
1.0000 | ORAL_TABLET | Freq: Every day | ORAL | Status: DC
  Administered 2019-03-03: 13:00:00 1 via ORAL
  Filled 2019-03-02: qty 1

## 2019-03-02 MED ORDER — SODIUM CHLORIDE (PF) 0.9 % IJ SOLN
INTRAMUSCULAR | Status: DC | PRN
  Administered 2019-03-02: 12 mL/h via EPIDURAL

## 2019-03-02 MED ORDER — BENZOCAINE-MENTHOL 20-0.5 % EX AERO
1.0000 "application " | INHALATION_SPRAY | CUTANEOUS | Status: DC | PRN
  Administered 2019-03-02: 1 via TOPICAL
  Filled 2019-03-02: qty 56

## 2019-03-02 MED ORDER — IBUPROFEN 600 MG PO TABS
600.0000 mg | ORAL_TABLET | Freq: Four times a day (QID) | ORAL | Status: DC
  Administered 2019-03-02 – 2019-03-04 (×7): 600 mg via ORAL
  Filled 2019-03-02 (×7): qty 1

## 2019-03-02 MED ORDER — DIPHENHYDRAMINE HCL 50 MG/ML IJ SOLN: 12.5000 mg | INTRAMUSCULAR | Status: DC | PRN

## 2019-03-02 MED ORDER — WITCH HAZEL-GLYCERIN EX PADS
1.0000 "application " | MEDICATED_PAD | CUTANEOUS | Status: DC | PRN
  Administered 2019-03-02: 1 via TOPICAL

## 2019-03-02 MED ORDER — DIPHENHYDRAMINE HCL 25 MG PO CAPS: 25.0000 mg | ORAL_CAPSULE | Freq: Four times a day (QID) | ORAL | Status: DC | PRN

## 2019-03-02 MED ORDER — METHYLERGONOVINE MALEATE 0.2 MG PO TABS: 0.2000 mg | ORAL_TABLET | ORAL | Status: DC | PRN

## 2019-03-02 MED ORDER — TETANUS-DIPHTH-ACELL PERTUSSIS 5-2.5-18.5 LF-MCG/0.5 IM SUSP: 0.5000 mL | Freq: Once | INTRAMUSCULAR | Status: DC

## 2019-03-02 MED ORDER — ONDANSETRON HCL 4 MG PO TABS: 4.0000 mg | ORAL_TABLET | ORAL | Status: DC | PRN

## 2019-03-02 MED ORDER — FENTANYL-BUPIVACAINE-NACL 0.5-0.125-0.9 MG/250ML-% EP SOLN
12.0000 mL/h | EPIDURAL | Status: DC | PRN
  Filled 2019-03-02: qty 250

## 2019-03-02 MED ORDER — ZOLPIDEM TARTRATE 5 MG PO TABS: 5.0000 mg | ORAL_TABLET | Freq: Every evening | ORAL | Status: DC | PRN

## 2019-03-02 MED ORDER — COCONUT OIL OIL: 1.0000 "application " | TOPICAL_OIL | Status: DC | PRN

## 2019-03-02 MED ORDER — ACETAMINOPHEN 325 MG PO TABS
650.0000 mg | ORAL_TABLET | ORAL | Status: DC | PRN
  Administered 2019-03-02: 21:00:00 650 mg via ORAL
  Filled 2019-03-02 (×2): qty 2

## 2019-03-02 MED ORDER — OXYCODONE-ACETAMINOPHEN 5-325 MG PO TABS: 2.0000 | ORAL_TABLET | ORAL | Status: DC | PRN

## 2019-03-02 MED ORDER — LACTATED RINGERS IV SOLN: 500.0000 mL | Freq: Once | INTRAVENOUS | Status: DC

## 2019-03-02 MED ORDER — SENNOSIDES-DOCUSATE SODIUM 8.6-50 MG PO TABS
2.0000 | ORAL_TABLET | ORAL | Status: DC
  Administered 2019-03-02 – 2019-03-03 (×2): 2 via ORAL
  Filled 2019-03-02 (×2): qty 2

## 2019-03-02 MED ORDER — LACTATED RINGERS IV SOLN
500.0000 mL | Freq: Once | INTRAVENOUS | Status: AC
  Administered 2019-03-02: 02:00:00 500 mL via INTRAVENOUS

## 2019-03-02 MED ORDER — DIBUCAINE (PERIANAL) 1 % EX OINT: 1.0000 "application " | TOPICAL_OINTMENT | CUTANEOUS | Status: DC | PRN

## 2019-03-02 MED ORDER — ONDANSETRON HCL 4 MG/2ML IJ SOLN: 4.0000 mg | INTRAMUSCULAR | Status: DC | PRN

## 2019-03-02 NOTE — Anesthesia Postprocedure Evaluation (Signed)
Anesthesia Post Note  Patient: Kelsey Wheeler  Procedure(s) Performed: AN AD [place]     Patient location during evaluation: Mother Baby Anesthesia Type: Epidural Level of consciousness: awake and alert Pain management: pain level controlled Vital Signs Assessment: post-procedure vital signs reviewed and stable Respiratory status: spontaneous breathing, nonlabored ventilation and respiratory function stable Cardiovascular status: stable Postop Assessment: no headache, no backache and epidural receding Anesthetic complications: no    Last Vitals:  Vitals:   03/02/19 1430 03/02/19 1530  BP: 129/84 123/83  Pulse: (!) 101 95  Resp: 17 18  Temp: 36.6 C 36.4 C  SpO2:  100%    Last Pain:  Vitals:   03/02/19 1530  TempSrc: Oral  PainSc: 0-No pain   Pain Goal:                   [name]

## 2019-03-02 NOTE — Progress Notes (Signed)
Kelsey Wheeler is a 23 y.o. G1P0 at [redacted]w[redacted]d by LMP admitted for induction of labor due to Hydramnios.  Subjective: Comfortable  Objective: BP 121/88   Pulse (!) 110   Temp 98.5 F (36.9 C) (Oral)   Resp 18   Ht 5' 4.5" (1.638 m)   Wt 103.4 kg   LMP  (LMP Unknown)   SpO2 97%   BMI 38.53 kg/m  No intake/output data recorded. No intake/output data recorded.  FHT:  FHR: 145 bpm, variability: moderate,  accelerations:  Present,  decelerations:  Absent UC:   irregular, every 2-5 minutes SVE:   Dilation: 6 Effacement (%): 80 Station: -3 Exam by:: Hector Shade, RN    Labs: Lab Results  Component Value Date   WBC 14.7 (H) 03/01/2019   HGB 13.0 03/01/2019   HCT 39.1 03/01/2019   MCV 90.7 03/01/2019   PLT 235 03/01/2019    Assessment / Plan: IOL for polyhydrmamios- foley now out on Pitocin  Labor: Progressing normally Preeclampsia:  no signs or symptoms of toxicity Fetal Wellbeing:  Category I Pain Control:  Labor support without medications I/D:  n/a Anticipated MOD:  NSVD  Kelsey Wheeler J 03/02/2019, 6:39 AM

## 2019-03-02 NOTE — Anesthesia Procedure Notes (Signed)
Epidural Patient location during procedure: OB Start time: 03/02/2019 1:59 AM End time: 03/02/2019 2:02 AM  Staffing Anesthesiologist: Brennan Bailey, MD Performed: anesthesiologist   Preanesthetic Checklist Completed: patient identified, pre-op evaluation, timeout performed, IV checked, risks and benefits discussed and monitors and equipment checked  Epidural Patient position: sitting Prep: site prepped and draped and DuraPrep Patient monitoring: continuous pulse ox, blood pressure and heart rate Approach: midline Location: L3-L4 Injection technique: LOR air  Needle:  Needle type: Tuohy  Needle gauge: 17 G Needle length: 9 cm Catheter type: closed end flexible Catheter size: 19 Gauge Catheter at skin depth: 11 cm Test dose: negative and Other (1% lidocaine)  Assessment Events: blood not aspirated, injection not painful, no injection resistance, negative IV test and no paresthesia  Additional Notes Patient identified. Risks, benefits, and alternatives discussed with patient including but not limited to bleeding, infection, nerve damage, paralysis, failed block, incomplete pain control, headache, blood pressure changes, nausea, vomiting, reactions to medication, itching, and postpartum back pain. Confirmed with bedside nurse the patient's most recent platelet count. Confirmed with patient that they are not currently taking any anticoagulation, have any bleeding history, or any family history of bleeding disorders. Patient expressed understanding and wished to proceed. All questions were answered. Sterile technique was used throughout the entire procedure. Please see nursing notes for vital signs.   Crisp LOR on first pass. Test dose was given through epidural catheter and negative prior to continuing to dose epidural or start infusion. Warning signs of high block given to the patient including shortness of breath, tingling/numbness in hands, complete motor block, or any concerning  symptoms with instructions to call for help. Patient was given instructions on fall risk and not to get out of bed. All questions and concerns addressed with instructions to call with any issues or inadequate analgesia.  Reason for block:procedure for pain

## 2019-03-02 NOTE — Progress Notes (Signed)
S: Doing well, pain well controlled with  Epidural, feeling some pelvic pressure. CNM at [facility] to evaluate fetal strip.    O: Vitals:   03/02/19 0900 03/02/19 0930 03/02/19 1000 03/02/19 1030  BP: 121/78 (!) 113/57 (!) 111/59 117/64  Pulse: 83 95 94 (!) 111  Resp: 16 16 18 18   Temp:   97.8 F (36.6 C)   TempSrc:   Oral   SpO2: 99% 99% 100% 100%  Weight:      Height:         FHT:  FHR: 140 bpm, variability: moderate,  accelerations:  Abscent,  decelerations:  Present intermittent shoulders with late decels and slow recovery to BL UC:   irregular, every 2-3 minutes SVE:   Dilation: 10 Effacement (%): 100 Station: Plus 1, Plus 2 Exam by:: Krystal Eaton RN    A / P: Induction of labor due to poly,  progressing well on pitocin Lates resolved with repositioning sidelying, will start active second stage in lateral positioning. Fetal Wellbeing:  Category II Pain Control:  Epidural  Anticipated MOD:  guarded   Dr. Ronita Hipps updated with patient status.   Juliene Pina, CNM, MSN 03/02/2019, 11:01 AM

## 2019-03-02 NOTE — Anesthesia Preprocedure Evaluation (Signed)
Anesthesia Evaluation  Patient identified by MRN, date of birth, ID band Patient awake    Reviewed: Allergy & Precautions, Patient's Chart, lab work & pertinent test results  History of Anesthesia Complications Negative for: history of anesthetic complications  Airway Mallampati: II  TM Distance: >3 FB Neck ROM: Full    Dental no notable dental hx.    Pulmonary neg pulmonary ROS,    Pulmonary exam normal        Cardiovascular negative cardio ROS Normal cardiovascular exam     Neuro/Psych negative neurological ROS     GI/Hepatic negative GI ROS, Neg liver ROS,   Endo/Other  negative endocrine ROS  Renal/GU negative Renal ROS     Musculoskeletal negative musculoskeletal ROS (+)   Abdominal   Peds  Hematology negative hematology ROS (+)   Anesthesia Other Findings   Reproductive/Obstetrics (+) Pregnancy                             Anesthesia Physical Anesthesia Plan  ASA: II  Anesthesia Plan: Epidural   Post-op Pain Management:    Induction:   PONV Risk Score and Plan: Treatment may vary due to age or medical condition  Airway Management Planned: Natural Airway  Additional Equipment:   Intra-op Plan:   Post-operative Plan:   Informed Consent: I have reviewed the patients History and Physical, chart, labs and discussed the procedure including the risks, benefits and alternatives for the proposed anesthesia with the patient or authorized representative who has indicated his/her understanding and acceptance.       Plan Discussed with:   Anesthesia Plan Comments:         Anesthesia Quick Evaluation

## 2019-03-02 NOTE — Progress Notes (Addendum)
Kelsey Wheeler is a 23 y.o. G1P0 at [redacted]w[redacted]d by LMP admitted for induction of labor due to Hydramnios.  Subjective: Comfortable  Objective: BP (!) 98/42   Pulse 97   Temp 98.3 F (36.8 C) (Oral)   Resp 16   Ht 5' 4.5" (1.638 m)   Wt 103.4 kg   LMP  (LMP Unknown)   SpO2 97%   BMI 38.53 kg/m  I/O last 3 completed shifts: In: -  Out: 250 [Urine:250] Total I/O In: 2910.3 [I.V.:2910.3] Out: -   FHT:  FHR: 145 bpm, variability: moderate,  accelerations:  Present,  decelerations:  Absent UC:   irregular, every 2-5 minutes SVE:   6/80/-1 IUPC placed   Labs: Lab Results  Component Value Date   WBC 14.7 (H) 03/01/2019   HGB 13.0 03/01/2019   HCT 39.1 03/01/2019   MCV 90.7 03/01/2019   PLT 235 03/01/2019    Assessment / Plan: IOL for polyhydrmamios- foley now out on Pitocin  Labor: Progressing normally Preeclampsia:  no signs or symptoms of toxicity Fetal Wellbeing:  Category I Pain Control:  epidural I/D:  n/a Anticipated MOD:  NSVD  Kelsey Wheeler J 03/02/2019, 8:10 AM

## 2019-03-02 NOTE — Plan of Care (Signed)
  Problem: Education: Goal: Knowledge of General Education information will improve Description: Including pain rating scale, medication(s)/side effects and non-pharmacologic comfort measures Outcome: Completed/Met   Problem: Clinical Measurements: Goal: Ability to maintain clinical measurements within normal limits will improve Outcome: Completed/Met Goal: Will remain free from infection Outcome: Completed/Met Goal: Diagnostic test results will improve Outcome: Completed/Met Goal: Respiratory complications will improve Outcome: Completed/Met Goal: Cardiovascular complication will be avoided Outcome: Completed/Met   Problem: Activity: Goal: Risk for activity intolerance will decrease Outcome: Completed/Met   Problem: Nutrition: Goal: Adequate nutrition will be maintained Outcome: Completed/Met   Problem: Pain Managment: Goal: General experience of comfort will improve Outcome: Completed/Met   Problem: Safety: Goal: Ability to remain free from injury will improve Outcome: Completed/Met   Problem: Skin Integrity: Goal: Risk for impaired skin integrity will decrease Outcome: Completed/Met

## 2019-03-03 DIAGNOSIS — O9902 Anemia complicating childbirth: Secondary | ICD-10-CM | POA: Diagnosis not present

## 2019-03-03 LAB — CBC
HCT: 27.3 % — ABNORMAL LOW (ref 36.0–46.0)
Hemoglobin: 9.5 g/dL — ABNORMAL LOW (ref 12.0–15.0)
MCH: 32 pg (ref 26.0–34.0)
MCHC: 34.8 g/dL (ref 30.0–36.0)
MCV: 91.9 fL (ref 80.0–100.0)
Platelets: 200 10*3/uL (ref 150–400)
RBC: 2.97 MIL/uL — ABNORMAL LOW (ref 3.87–5.11)
RDW: 14 % (ref 11.5–15.5)
WBC: 16 10*3/uL — ABNORMAL HIGH (ref 4.0–10.5)
nRBC: 0 % (ref 0.0–0.2)

## 2019-03-03 MED ORDER — MAGNESIUM OXIDE 400 (241.3 MG) MG PO TABS
400.0000 mg | ORAL_TABLET | Freq: Every day | ORAL | Status: DC
  Administered 2019-03-03: 13:00:00 400 mg via ORAL
  Filled 2019-03-03: qty 1

## 2019-03-03 MED ORDER — INFLUENZA VAC SPLIT QUAD 0.5 ML IM SUSY
0.5000 mL | PREFILLED_SYRINGE | Freq: Once | INTRAMUSCULAR | Status: DC
  Filled 2019-03-03: qty 0.5

## 2019-03-03 MED ORDER — RHO D IMMUNE GLOBULIN 1500 UNIT/2ML IJ SOSY
300.0000 ug | PREFILLED_SYRINGE | Freq: Once | INTRAMUSCULAR | Status: AC
  Administered 2019-03-03: 14:00:00 300 ug via INTRAVENOUS
  Filled 2019-03-03: qty 2

## 2019-03-03 MED ORDER — POLYSACCHARIDE IRON COMPLEX 150 MG PO CAPS
150.0000 mg | ORAL_CAPSULE | Freq: Every day | ORAL | Status: DC
  Administered 2019-03-03: 13:00:00 150 mg via ORAL
  Filled 2019-03-03: qty 1

## 2019-03-03 MED ORDER — INFLUENZA VAC A&B SA ADJ QUAD 0.5 ML IM PRSY: 0.5000 mL | PREFILLED_SYRINGE | INTRAMUSCULAR | Status: DC

## 2019-03-03 NOTE — Progress Notes (Signed)
Patient refused the TDAP and FLU vaccinations. RN gave the VIS(s).

## 2019-03-03 NOTE — Progress Notes (Signed)
Patient ID: Kelsey Wheeler, female   DOB: April 13, 1996, 23 y.o.   MRN: [id number] PPD # 1 S/P NSVD  Live born female  Birth Weight: 7 lb 4.4 oz (3300 g) APGAR: 8, 9  Newborn Delivery   Birth date/time: 03/02/2019 12:09:00 Delivery type: Vaginal, Spontaneous     Baby name: [name] Delivering provider: Brien Few  Episiotomy:None   Lacerations:2nd degree   Feeding: breast  Pain control at delivery: Epidural   S:  Reports feeling well, working on latch with lactation.             Tolerating po/ No nausea or vomiting             Bleeding is moderate             Pain controlled with ibuprofen (OTC)             Up ad lib / ambulatory / voiding without difficulties   O:  A & O x 3, in no apparent distress              VS:  Vitals:   03/02/19 1530 03/02/19 2038 03/02/19 2348 03/03/19 0524  BP: 123/83 133/76 (!) 107/50 127/69  Pulse: 95 (!) 126 (!) 107 (!) 111  Resp: 18 18 18 18   Temp: 97.6 F (36.4 C) (!) 97.5 F (36.4 C) 98.6 F (37 C) 98 F (36.7 C)  TempSrc: Oral Oral Oral Oral  SpO2: 100%     Weight:      Height:        LABS:  Recent Labs    03/01/19 0643 03/03/19 0501  WBC 14.7* 16.0*  HGB 13.0 9.5*  HCT 39.1 27.3*  PLT 235 200    Blood type: --/--/O NEG Performed at [facility] [address] [place], [place] [address]  [phone]/19 0501)  Rubella: Immune (02/25 0000)   I&O: I/O last 3 completed shifts: In: 3194.2 [I.V.:3194.2] Out: 546 [Urine:650; Blood:249]          No intake/output data recorded.  Vaccines: TDaP UTD         Flu    declines   Lungs: Clear and unlabored  Heart: regular rate and rhythm / no murmurs  Abdomen: soft, non-tender, non-distended             Fundus: firm, non-tender, U-1  Perineum: repair intact, no edema  Lochia: small  Extremities: trace pedal edema, no calf pain or tenderness    A/P: PPD # 1 23 y.o., G1P1001   Principal Problem:   Postpartum care following vaginal delivery 9/18 Active Problems:   Encounter for  induction of labor   SVD (spontaneous vaginal delivery)   Perineal laceration, second degree   Maternal anemia, with delivery  - started oral Fe and Mag Ox  Doing well - stable status  Routine post partum orders  Anticipate discharge tomorrow    Juliene Pina, MSN, CNM 03/03/2019, 9:26 AM

## 2019-03-03 NOTE — Lactation Note (Signed)
This note was copied from a baby's chart. Lactation Consultation Note  Patient Name: Kelsey Wheeler [id number] Date: 03/03/2019 Reason for consult: Initial assessment;Primapara;Term Baby is 22 hours old/3% weight loss.  Baby latched x3 but too sleepy during the night to feed.  Baby is currently sleeping skin to skin on mom's chest.  Baby removed from chest and waking techniques done.  Baby remained sleepy but did suck on gloved finger.  Positioned baby in football hold on left breast.  Reviewed hand expression and large drops of colostrum expressed into infant's mouth.  Baby would not open mouth or show any signs of hunger.  Instructed to attempt to wake and feed every hour or with feeding cues.  Encouraged to call for assist prn.  Breastfeeding consultation services information given and reviewed.  Maternal Data Has patient been taught Hand Expression?: Yes Does the patient have breastfeeding experience prior to this delivery?: No  Feeding Feeding Type: Breast Fed  LATCH Score Latch: Too sleepy or reluctant, no latch achieved, no sucking elicited.  Audible Swallowing: None  Type of Nipple: Everted at rest and after stimulation  Comfort (Breast/Nipple): Soft / non-tender  Hold (Positioning): Assistance needed to correctly position infant at breast and maintain latch.  LATCH Score: 5  Interventions Interventions: Hand express  Lactation Tools Discussed/Used     Consult Status Consult Status: Follow-up Date: 03/04/19 Follow-up type: In-patient    Ave Filter 03/03/2019, 10:46 AM

## 2019-03-04 LAB — RH IG WORKUP (INCLUDES ABO/RH)
ABO/RH(D): O NEG
Fetal Screen: NEGATIVE
Gestational Age(Wks): 39.4
Unit division: 0

## 2019-03-04 NOTE — Discharge Summary (Signed)
Obstetric Discharge Summary Reason for Admission: induction of labor Prenatal Procedures: none Intrapartum Procedures: spontaneous vaginal delivery Postpartum Procedures: none Complications-Operative and Postpartum: 2 degree perineal laceration Hemoglobin  Date Value Ref Range Status  03/03/2019 9.5 (L) 12.0 - 15.0 g/dL Final    Comment:    REPEATED TO VERIFY   HCT  Date Value Ref Range Status  03/03/2019 27.3 (L) 36.0 - 46.0 % Final    Physical Exam:  General: alert, cooperative and appears stated age 23: appropriate Uterine Fundus: firm Incision: healing well DVT Evaluation: No evidence of DVT seen on physical exam.  Discharge Diagnoses: Term Pregnancy-delivered  Discharge Information: Date: 03/04/2019 Activity: pelvic rest Diet: routine Medications: PNV, Ibuprofen and Colace Condition: stable Instructions: refer to practice specific booklet Discharge to: home   Newborn Data: Live born female  Birth Weight: 7 lb 4.4 oz (3300 g) APGAR: 8, 9  Newborn Delivery   Birth date/time: 03/02/2019 12:09:00 Delivery type: Vaginal, Spontaneous      Home with mother.  Mariajose Mow J 03/04/2019, 11:40 AM

## 2019-03-04 NOTE — Lactation Note (Signed)
This note was copied from a baby's chart. Lactation Consultation Note  Patient Name: Kelsey Wheeler [id number] Date: 03/04/2019 Reason for consult: Follow-up assessment;Primapara;Term Baby is 45 hours old/6% weight loss.  Mom states breastfeeding is going much better.  Discussed milk coming to volume and the prevention and treatment of engorgement.  Mom has a manual pump for prn use.  She plans on obtaining an electric pump also.  No questions or concerns.  Reviewed lactation outpatient services and encouraged to call prn.  Maternal Data    Feeding Feeding Type: Breast Fed  LATCH Score                   Interventions    Lactation Tools Discussed/Used     Consult Status Consult Status: Complete Follow-up type: Call as needed    Ave Filter 03/04/2019, 9:29 AM

## 2019-03-04 NOTE — Progress Notes (Signed)
Patient ID: Kelsey Wheeler, female   DOB: June 29, 1995, 23 y.o.   MRN: [id number] PPD # 2 S/P NSVD  Live born female  Birth Weight: 7 lb 4.4 oz (3300 g) APGAR: 8, 9  Newborn Delivery   Birth date/time: 03/02/2019 12:09:00 Delivery type: Vaginal, Spontaneous     Baby name: Kelsey Wheeler Delivering provider: Brien Wheeler  Episiotomy:None   Lacerations:2nd degree   Feeding: breast  Pain control at delivery: Epidural   S:  Reports feeling well, working on latch with lactation.             Tolerating po/ No nausea or vomiting             Bleeding is moderate             Pain controlled with ibuprofen (OTC)             Up ad lib / ambulatory / voiding without difficulties   O:  A & O x 3, in no apparent distress              VS:  Vitals:   03/02/19 2348 03/03/19 0524 03/03/19 1359 03/03/19 2216  BP: (!) 107/50 127/69 113/72 129/70  Pulse: (!) 107 (!) 111 (!) 102 (!) 105  Resp: 18 18 18 18   Temp: 98.6 F (37 C) 98 F (36.7 C) 97.7 F (36.5 C) 97.6 F (36.4 C)  TempSrc: Oral Oral  Oral  SpO2:      Weight:      Height:        LABS:  Recent Labs    03/03/19 0501  WBC 16.0*  HGB 9.5*  HCT 27.3*  PLT 200    Blood type: --/--/O NEG (09/19 0501)  Rubella: Immune (02/25 0000)   I&O: No intake/output data recorded.          No intake/output data recorded.  Vaccines: TDaP UTD         Flu    declines   Lungs: Clear and unlabored  Heart: regular rate and rhythm / no murmurs  Abdomen: soft, non-tender, non-distended             Fundus: firm, non-tender, U-1  Perineum: repair intact, no edema  Lochia: small  Extremities: trace pedal edema, no calf pain or tenderness    A/P: PPD # 3 23 y.o., G1P1001   Principal Problem:   Postpartum care following vaginal delivery 9/18 Active Problems:   Encounter for induction of labor   SVD (spontaneous vaginal delivery)   Perineal laceration, second degree   Maternal anemia, with delivery  - started oral Fe and Mag Ox  Doing well -  stable status  Routine post partum orders  Anticipate discharge     Lovenia Kim, MSN, CNM 03/04/2019, 11:10 AM

## 2019-03-05 LAB — BPAM RBC
Blood Product Expiration Date: 202010092359
Blood Product Expiration Date: 202010122359
Unit Type and Rh: 9500
Unit Type and Rh: 9500

## 2019-03-05 LAB — TYPE AND SCREEN
ABO/RH(D): O NEG
Antibody Screen: POSITIVE
Unit division: 0
Unit division: 0

## 2019-04-03 ENCOUNTER — Other Ambulatory Visit: Payer: Self-pay

## 2019-04-03 ENCOUNTER — Ambulatory Visit (INDEPENDENT_AMBULATORY_CARE_PROVIDER_SITE_OTHER): Payer: No Typology Code available for payment source

## 2019-04-03 VITALS — Temp 96.4°F

## 2019-04-03 DIAGNOSIS — Z23 Encounter for immunization: Secondary | ICD-10-CM

## 2019-06-29 ENCOUNTER — Encounter (HOSPITAL_COMMUNITY): Payer: Self-pay | Admitting: Internal Medicine

## 2019-10-07 IMAGING — CT CT HEAD CODE STROKE
3 series · 15 of 45 positions shown, 18 images · non-contrast
Comparison: None.

CLINICAL DATA: Code stroke.  Left arm weakness

EXAM:
CT HEAD WITHOUT CONTRAST
TECHNIQUE: Contiguous axial images were obtained from the base of the skull
through the vertex without intravenous contrast.

[Series 3: head 5.0 h30s · axial · 0.42mm/px · z∈[-37,+78]mm · 9 of 28 slices shown, 12 images]
[im 3/28  brain]
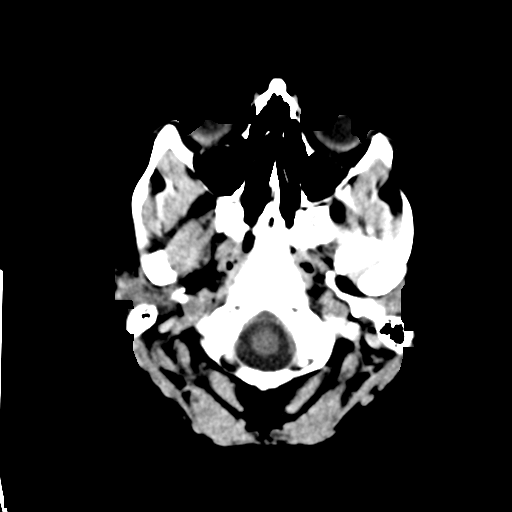
[im 3/28  bone]
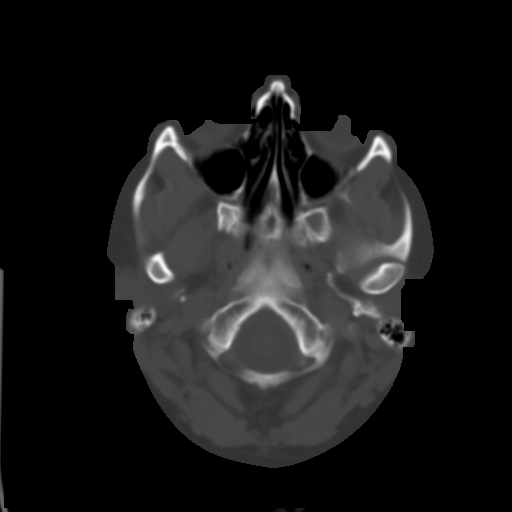
[im 6/28  brain]
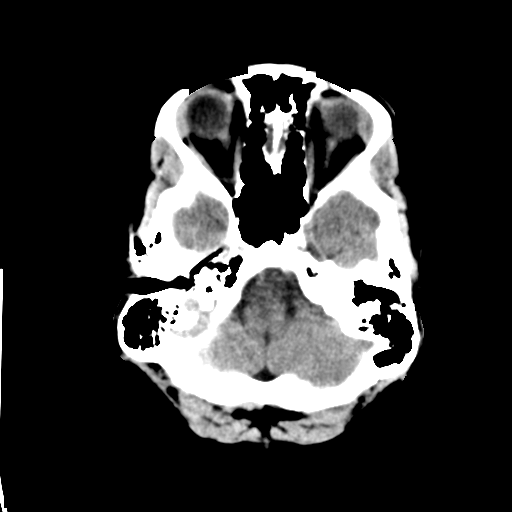
[im 9/28  brain]
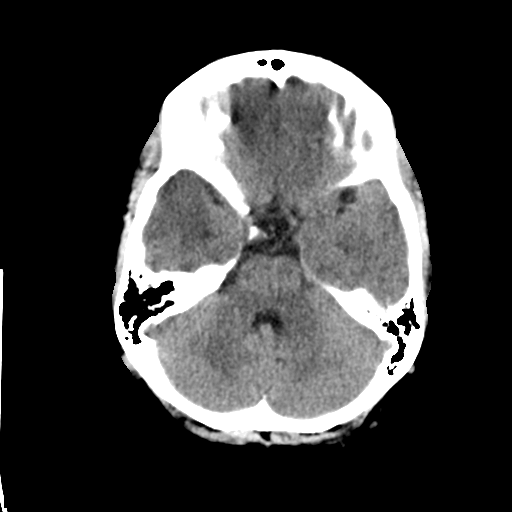
[im 12/28  brain]
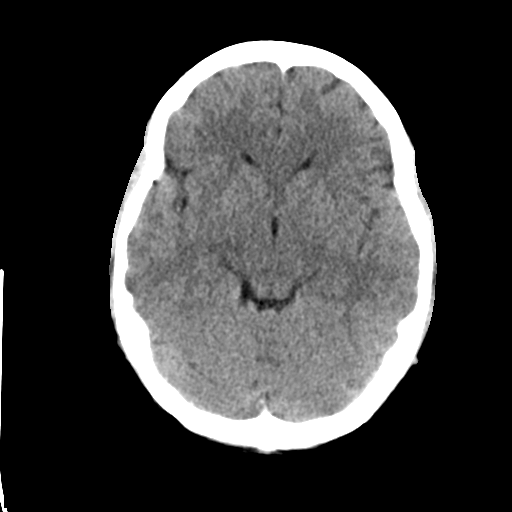
[im 15/28  brain]
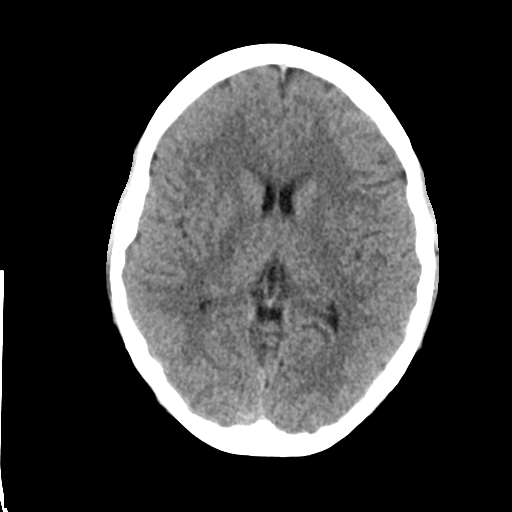
[im 15/28  bone]
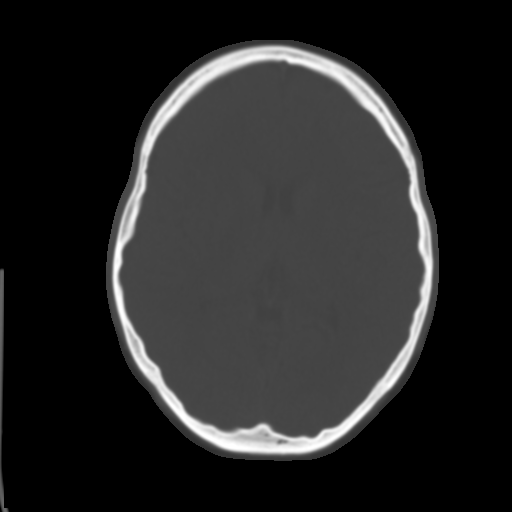
[im 17/28  brain]
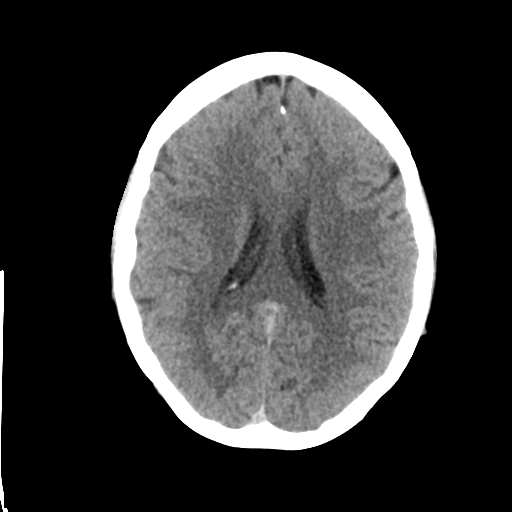
[im 20/28  brain]
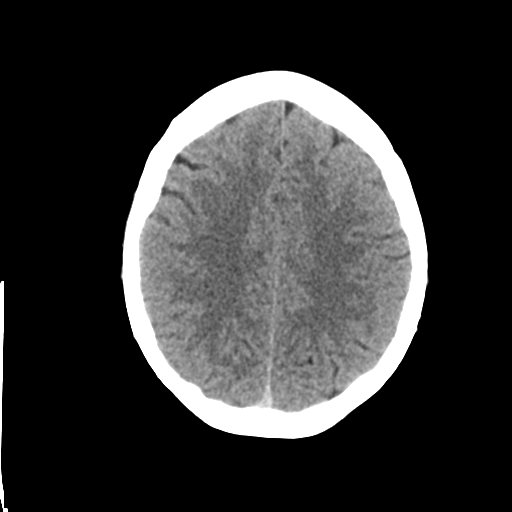
[im 23/28  brain]
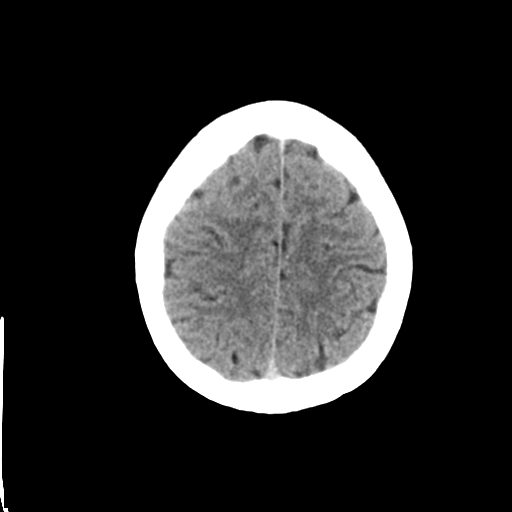
[im 26/28  brain]
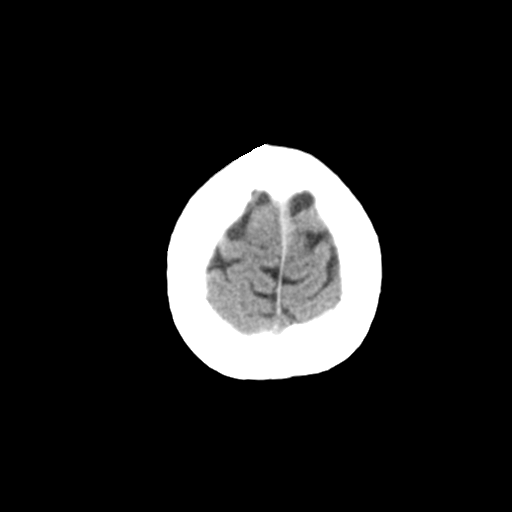
[im 26/28  bone]
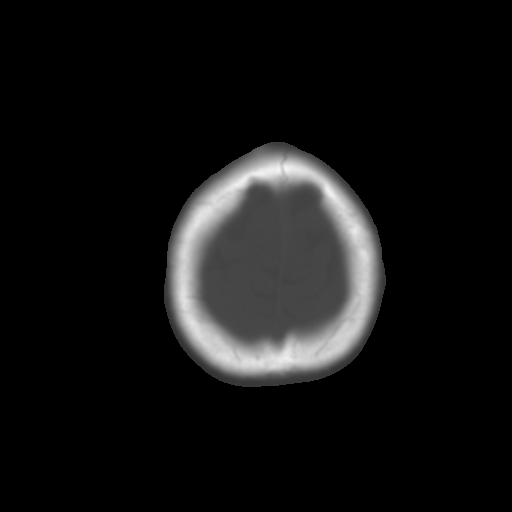

[Series 5: head 3.0 mpr cor · coronal · 0.28mm/px · 3 of 63 slices shown]
[im 21/63  brain]
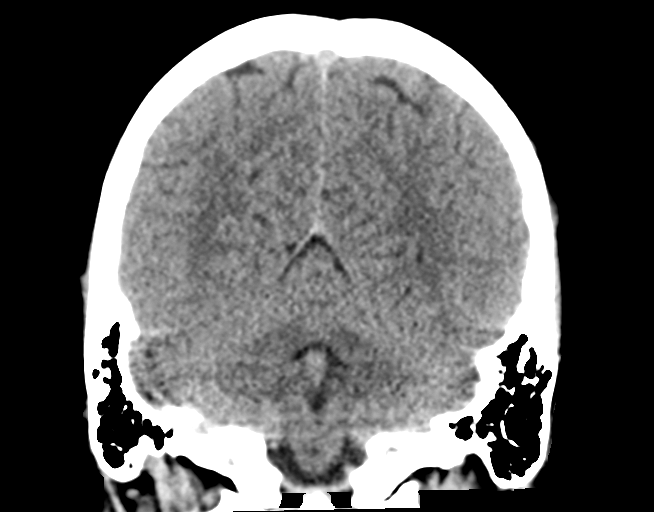
[im 28/63  brain]
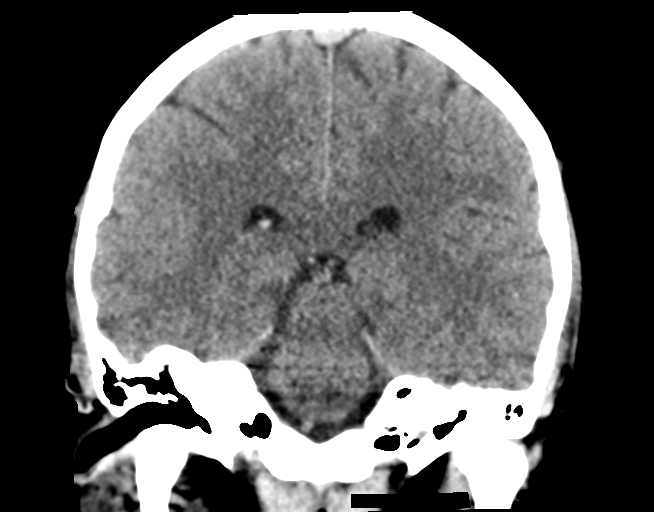
[im 35/63  brain]
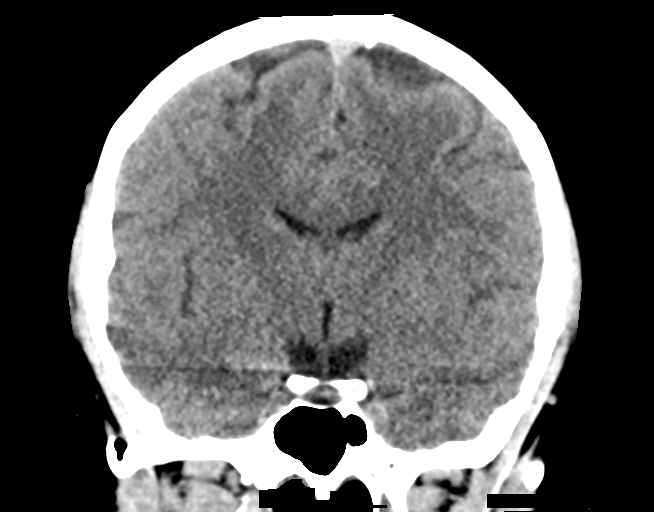

[Series 6: head 3.0 mpr sag · sagittal · 0.30mm/px · 3 of 59 slices shown]
[im 20/59  brain]
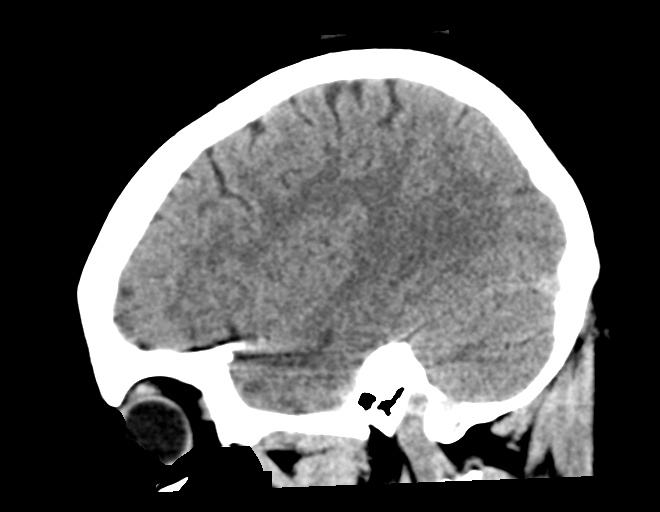
[im 30/59  brain]
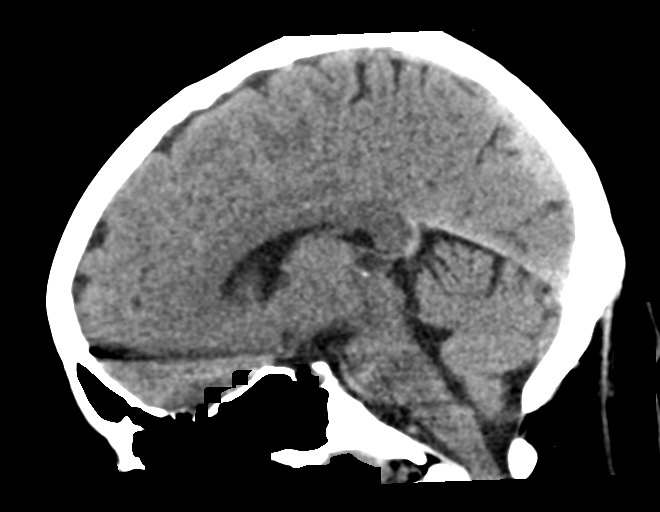
[im 39/59  brain]
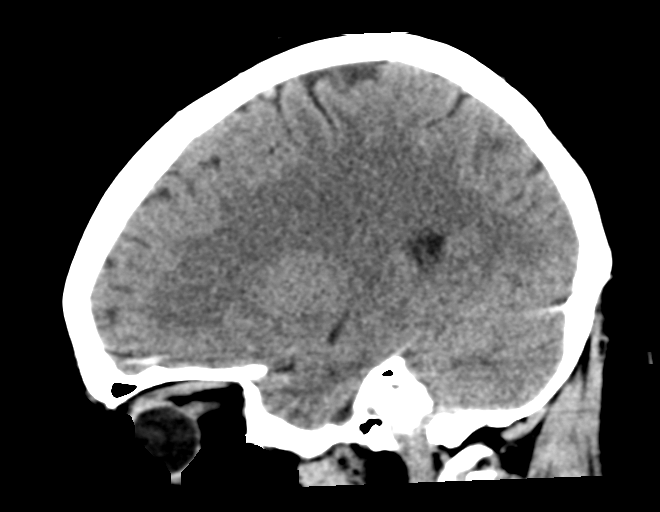

[15 of 45 positions shown; findings below may reference images not displayed]

FINDINGS: Brain: No evidence of acute infarction, hemorrhage, hydrocephalus,
extra-axial collection or mass lesion/mass effect.

Vascular: No hyperdense vessel or unexpected calcification.

Skull: Normal. Negative for fracture or focal lesion.

Sinuses/Orbits: No acute finding.

Other: These results were communicated to Dr. Romasz at [DATE] [place]
06/23/2018by text page via the [employer] messaging system.

ASPECTS ([place] Stroke Program Early CT Score)

- Ganglionic level infarction (caudate, lentiform nuclei, internal
capsule, insula, M1-M3 cortex): 7

- Supraganglionic infarction (M4-M6 cortex): 3

Total score (0-10 with 10 being normal): 10
IMPRESSION: Normal head CT.  ASPECTS is 10.

## 2019-11-30 ENCOUNTER — Other Ambulatory Visit: Payer: Self-pay | Admitting: Internal Medicine

## 2019-11-30 MED ORDER — AZITHROMYCIN 250 MG PO TABS: ORAL_TABLET | ORAL | 1 refills | Status: DC

## 2019-12-03 ENCOUNTER — Ambulatory Visit (INDEPENDENT_AMBULATORY_CARE_PROVIDER_SITE_OTHER): Payer: No Typology Code available for payment source | Admitting: Adult Health Nurse Practitioner

## 2019-12-03 ENCOUNTER — Other Ambulatory Visit: Payer: Self-pay

## 2019-12-03 ENCOUNTER — Encounter: Payer: Self-pay | Admitting: Adult Health Nurse Practitioner

## 2019-12-03 VITALS — BP 134/86 | HR 117 | Temp 97.0°F | Wt 195.0 lb

## 2019-12-03 DIAGNOSIS — J019 Acute sinusitis, unspecified: Secondary | ICD-10-CM

## 2019-12-03 MED ORDER — PREDNISONE 10 MG (21) PO TBPK: ORAL_TABLET | Freq: Every day | ORAL | 0 refills | Status: DC

## 2019-12-03 MED ORDER — DEXAMETHASONE SODIUM PHOSPHATE 10 MG/ML IJ SOLN
10.0000 mg | Freq: Once | INTRAMUSCULAR | Status: AC
  Administered 2019-12-03: 10 mg via INTRAMUSCULAR

## 2019-12-03 NOTE — Progress Notes (Signed)
Assessment and Plan:  There are no diagnoses linked to this encounter.    Further disposition pending results of labs. Discussed med's effects and SE's.   Over 30 minutes of exam, counseling, chart review, and critical decision making was performed.   Future Appointments  Date Time Provider Department Center  01/01/2020  9:00 AM Elder Negus, NP [facility] None    ------------------------------------------------------------------------------------------------------------------   HPI 24 y.o.female presents for evaluation of sinus symptoms.  She initally has a sore throat 6 days ago that then progressed to nasal symptoms and now including a cough. She was given azithromycin on three days ago on 6/18 and has dose of this left. She denies any side effects from this medication but reports her symptoms have not improved.  Now she is having congested cough and nasal congestion.  Reports some popping of her ears . Report her cough is non-productive and it is worse in the morning.  She reports she is able to expel some mucus in the mornings. Her cough does not waker her up at night.   She is blowing nose frequently.  Reports that it goes back and forth with nasal congestions and running.  She is also having some sinus pressure across her cheeks.  She reports she has been using dayquil and robitussin at night that has not been helping much.  She is not open using a neti pot as she has tried one in past.  She went for rapid  SARS-COV2 test and this was negative.   Past Medical History:  Diagnosis Date   ADD (attention deficit disorder) 03/03/2014   Hx of thyroiditis      No Known Allergies  Current Outpatient Medications on File Prior to Visit  Medication Sig   azithromycin (ZITHROMAX) 250 MG tablet Take 2 tablets with Food on  Day 1, then 1 tablet Daily with Food for Infection   famotidine (PEPCID) 10 MG tablet Take 10 mg by mouth daily as needed for heartburn or indigestion. (Patient not  taking: Reported on 12/03/2019)   Prenatal Vit-Fe Fumarate-FA (PRENATAL MULTIVITAMIN) TABS tablet Take 1 tablet by mouth daily at 12 noon. Take one tablet by mouth daily. (Patient not taking: Reported on 12/03/2019)   No current facility-administered medications on file prior to visit.    ROS: all negative except above.   Physical Exam:  BP 134/86    Pulse (!) 117    Temp (!) 97 F (36.1 C)    Wt 195 lb (88.5 kg)    LMP 11/16/2019    SpO2 96%    Breastfeeding No    BMI 32.95 kg/m   General Appearance: Well nourished, in no apparent distress. Eyes: PERRLA, EOMs, conjunctiva no swelling or erythema Sinuses: Frontal/maxillary tenderness noted. ENT/Mouth: Ext aud canals clear, TMs without erythema, bulging. No erythema, swelling, or exudate on post pharynx.  Tonsils not swollen or erythematous. Hearing normal.  Neck: Supple, thyroid normal.  Respiratory: Respiratory effort normal, BS equal bilaterally without rales, wheezing or stridor.  Rhonchi noted, some clearing with cough. Cardio: RRR with no MRGs. Brisk peripheral pulses without edema.  Abdomen: Soft, + BS.  Non tender, no guarding, rebound, hernias, masses. Lymphatics: Non tender without lymphadenopathy.  Musculoskeletal: Full ROM, 5/5 strength, normal gait.  Skin: Warm, dry without rashes, lesions, ecchymosis.  Neuro: Cranial nerves intact. Normal muscle tone, no cerebellar symptoms. Sensation intact.  Psych: Awake and oriented X 3, normal affect, Insight and Judgment appropriate.     Elder Negus, NP 12:45 PM [facility]  [facility] Ad[facility]

## 2019-12-03 NOTE — Patient Instructions (Signed)
° °  Take Mucinex DM every 12 hours.  This will help thin mucus as well as suppress the cough.  You will still cough but should help to get out the mucus.  The cough will be the last to go, this is normal.   You received injection of dexamethasone, steroid, to start reducing the inflammation.   You can also take sudafed this is a nasal decongestant.  Use saline spray in your nose four times a day.  You may also use flonase to help further reduce inflammation of sinuses.   We will send in prednisone taper for you, start taking this tomorrow, Tuesday.   Let us know if you have any new or worsening symptoms.

## 2019-12-31 NOTE — Progress Notes (Signed)
Complete Physical  Assessment and Plan: Health Maintenance- Discussed STD testing, safe sex, alcohol and drug awareness, drinking and driving dangers, wearing a seat belt and general safety measures for young adult.  Kelsey Wheeler was seen today for annual exam.  Diagnoses and all orders for this visit:  Encounter for annual physical exam Yearly  Attention deficit disorder (ADD) without hyperactivity No medication, used while in school Doing well with management at this time  Vitamin D deficiency  Discussed supplementation, has not been taking regularly  Screening for blood or protein in urine -     Urinalysis w microscopic + reflex cultur  Screening thyroid disorder Hx thyroiditis  BMI 33.0-33.9,adult Advised about management of weight Discussed dietary and exercise modifications Discussed diet in depth Rx   Screening Iron deficiency anemia -Iron panel  Screening for Vitamin Defciency -B12  Medication management Continued   Discussed med's effects and SE's. Screening labs and tests as requested with regular follow-up as recommended. Over 40 minutes of face to face interview, exam, counseling, chart review and critical decision making was performed  HPI  This very nice 24 y.o.female presents for complete physical.   She follows with [facility], Dr Billy Coast.  She is O- and had one injection of rhogam durringegnancy and second post delivery  Fathers blood type is unknown and he is not involved.  Reports she has support of family and friends.  She is now on oral birth control pills and denies being sexually active at this time. LMP: around 12/16/19.  She has history of anemia and vitamin D deficiency and ADD and not taking any medication for this.  Reports she mainly utilized this while she was in school.  She does not workout through she is on her feet for 12hours during her work day.  She reports eating protein waffles in the morning.  Sugar free syrup.  Coffee  Lunch:  Fast food typically She reports drinking soda, one a day but not every day.  Dinner: around 7 pm last night had, chicken, grilled, corn  Water: about one 12oz water bottle a day.  She has not taken any medication for weight loss in the past but want to talk about some temporary assistance with this to improve her health.  Finally, patient has history of Vitamin D Deficiency and last vitamin D was  Lab Results  Component Value Date   VD25OH 18 (L) 04/12/2018     Current Medications:  Current Outpatient Medications on File Prior to Visit  Medication Sig Dispense Refill   COLLAGEN PO Take by mouth daily.     Norethin-Eth Estrad-Fe Biphas (LO LOESTRIN FE PO) Take by mouth daily.     No current facility-administered medications on file prior to visit.   Health Maintenance:   Immunization History  Administered Date(s) Administered   HPV Quadrivalent 03/11/2008, 05/17/2008, 09/18/2008   Hepatitis B 12/06/2011   Influenza Inj Mdck Quad With Preservative 03/15/2017, 02/23/2018, 04/03/2019   Influenza Split 03/29/2013, 03/12/2014   Influenza,inj,quad, With Preservative 03/16/2016   PPD Test 11/15/2014, 11/05/2015, 12/17/2016   Tdap 09/07/2011    TD/TDAP: 2013 Influenza: 2019, due for 2020 Pneumovax: N/A Prevnar 13: N/A HPV vaccines: Complete 2010  LMP: Pregnant Sexually Active: No, STD testing offered, deferred. Pap: 2019 MGM: N/A  Pregnancy Screenings: HIV screening - Non-reactive RPR: Non-reactive Hep B : Negative Rubella: Immune   Allergies: No Known Allergies Medical History:  has Hx of thyroiditis; ADD (attention deficit disorder); Overweight (BMI 25.0-29.9); Vitamin D deficiency; Elevated LFTs;  Encounter for induction of labor; SVD (spontaneous vaginal delivery); Postpartum care following vaginal delivery 9/18; Perineal laceration, second degree; and Maternal anemia, with delivery on their problem list. Surgical History:  She  has a past surgical history  that includes Wisdom tooth extraction. Family History:  Her family history includes Atrial fibrillation in her paternal grandmother; Breast cancer in her maternal grandmother; Diabetes in her paternal grandfather and paternal grandmother; Heart disease in her paternal grandfather; Heart failure in her paternal grandfather; Hyperlipidemia in her father, maternal grandmother, mother, and paternal grandfather; Hypertension in her maternal grandmother; Lung cancer in her maternal grandfather and maternal grandmother. Social History:   reports that she has never smoked. She has never used smokeless tobacco. She reports previous alcohol use. She reports that she does not use drugs.  Review of Systems: Review of Systems  Constitutional: Negative for chills, diaphoresis, fever, malaise/fatigue and weight loss.  HENT: Negative for congestion, ear discharge, ear pain, hearing loss, nosebleeds, sinus pain, sore throat and tinnitus.   Eyes: Negative for blurred vision, double vision, photophobia, pain, discharge and redness.  Respiratory: Negative for cough, hemoptysis, sputum production, shortness of breath, wheezing and stridor.   Cardiovascular: Negative for chest pain, palpitations, orthopnea, claudication, leg swelling and PND.  Gastrointestinal: Negative for abdominal pain, blood in stool, constipation, diarrhea, heartburn, melena, nausea and vomiting.  Genitourinary: Negative for dysuria, flank pain, frequency, hematuria and urgency.  Musculoskeletal: Negative for back pain, falls, joint pain, myalgias and neck pain.  Skin: Negative for itching and rash.  Neurological: Negative for dizziness, tingling, tremors, sensory change, speech change, focal weakness, seizures, loss of consciousness, weakness and headaches.  Endo/Heme/Allergies: Negative for environmental allergies and polydipsia. Does not bruise/bleed easily.  Psychiatric/Behavioral: Negative for depression, hallucinations, memory loss,  substance abuse and suicidal ideas. The patient is not nervous/anxious and does not have insomnia.     Physical Exam: Estimated body mass index is 33.99 kg/m as calculated from the following:   Height as of this encounter: 5\' 4"  (1.626 m).   Weight as of this encounter: 198 lb (89.8 kg). BP 112/80    Pulse 67    Temp (!) 95.9 F (35.5 C)    Ht 5\' 4"  (1.626 m)    Wt 198 lb (89.8 kg)    SpO2 96%    BMI 33.99 kg/m  General Appearance: Well nourished, in no apparent distress.  Eyes: PERRLA, EOMs, conjunctiva no swelling or erythema, normal fundi and vessels.  Sinuses: No Frontal/maxillary tenderness  ENT/Mouth: Ext aud canals clear, normal light reflex with TMs without erythema, bulging. Good dentition. No erythema, swelling, or exudate on post pharynx. Tonsils not swollen or erythematous. Hearing normal.  Neck: Supple, thyroid normal. No bruits  Respiratory: Respiratory effort normal, BS equal bilaterally without rales, rhonchi, wheezing or stridor.  Cardio: RRR without murmurs, rubs or gallops. Brisk peripheral pulses without edema.  Chest: symmetric, with normal excursions and percussion.  Abdomen: nontender, no guarding, rebound, hernias, masses, or UTA organomegaly related to pregnancy. Lymphatics: Non tender without lymphadenopathy.  Genitourinary: defer Musculoskeletal: Full ROM all peripheral extremities,5/5 strength, and normal gait.  Skin: Warm, dry without rashes, lesions, ecchymosis. Neuro: Cranial nerves intact, reflexes equal bilaterally. Normal muscle tone, no cerebellar symptoms. Sensation intact.  Psych: Awake and oriented X 3, normal affect, Insight and Judgment appropriate.   EKG: defer  , NP 9:30 AM [facility]

## 2020-01-01 ENCOUNTER — Other Ambulatory Visit: Payer: Self-pay

## 2020-01-01 ENCOUNTER — Ambulatory Visit (INDEPENDENT_AMBULATORY_CARE_PROVIDER_SITE_OTHER): Payer: No Typology Code available for payment source | Admitting: Adult Health Nurse Practitioner

## 2020-01-01 ENCOUNTER — Encounter: Payer: Self-pay | Admitting: Adult Health Nurse Practitioner

## 2020-01-01 VITALS — BP 112/80 | HR 67 | Temp 95.9°F | Ht 64.0 in | Wt 198.0 lb

## 2020-01-01 DIAGNOSIS — Z130 Encounter for screening for diseases of the blood and blood-forming organs and certain disorders involving the immune mechanism: Secondary | ICD-10-CM

## 2020-01-01 DIAGNOSIS — Z79899 Other long term (current) drug therapy: Secondary | ICD-10-CM

## 2020-01-01 DIAGNOSIS — Z1389 Encounter for screening for other disorder: Secondary | ICD-10-CM

## 2020-01-01 DIAGNOSIS — Z1321 Encounter for screening for nutritional disorder: Secondary | ICD-10-CM

## 2020-01-01 DIAGNOSIS — E559 Vitamin D deficiency, unspecified: Secondary | ICD-10-CM

## 2020-01-01 DIAGNOSIS — Z1329 Encounter for screening for other suspected endocrine disorder: Secondary | ICD-10-CM

## 2020-01-01 DIAGNOSIS — Z131 Encounter for screening for diabetes mellitus: Secondary | ICD-10-CM

## 2020-01-01 DIAGNOSIS — Z0000 Encounter for general adult medical examination without abnormal findings: Secondary | ICD-10-CM

## 2020-01-01 DIAGNOSIS — Z6833 Body mass index (BMI) 33.0-33.9, adult: Secondary | ICD-10-CM

## 2020-01-01 DIAGNOSIS — F988 Other specified behavioral and emotional disorders with onset usually occurring in childhood and adolescence: Secondary | ICD-10-CM

## 2020-01-01 DIAGNOSIS — Z789 Other specified health status: Secondary | ICD-10-CM

## 2020-01-01 MED ORDER — PHENTERMINE HCL 15 MG PO CAPS: 15.0000 mg | ORAL_CAPSULE | ORAL | 0 refills | Status: DC

## 2020-01-01 NOTE — Patient Instructions (Signed)
We have sent in a prescription for phentermine 70m.  Take this every morning 383m to 1hour prior to your first meal.  Increase your water intake as this can make your mouth dry and also cause constipation.  This first month we will work on cutting all sugary drinks.  Stop fruit juices, sweet tea, sodas and look at sugar content of any beverage you consume.  For example if your drink 5 sodas a day/week cut this in half.  Every month another small step will be added to help support the medication prescribed.  The medicine alone will not be effective.  It takes lifestyle and behavior changes to be successful with your goals and to maintain them.  Small changes make a large impact!  We will follow up in one month to check on your progress and a weight check.   Ice your left foot, top and bottom.  You can also take ibuprofen 60056mvery 6 hours.  This will help with inflammation.  Take this medication with food.  Take this for 1-2 weeks.  Pay attention to the shoes you are wearing too.    Due to recent changes in healthcare laws, you may see the results of your imaging and laboratory studies on MyChart before your provider has had a chance to review them.  We understand that in some cases there may be results that are confusing or concerning to you. Not all laboratory results come back in the same time frame and the provider may be waiting for multiple results in order to interpret others.  Please give us [place] hours in order for your provider to thoroughly review all the results before contacting the office for clarification of your results.   +++++++++++++++++++++++++++++  Vit D  & Vit C 1,000 mg   are recommended to help protect  against the Covid-19 and other Corona viruses.    Also it's recommended  to take  Zinc 50 mg  to help  protect against the Covid-19   and best place to get  is also on [employer]  and don't pay more than 6-8 cents /pill !   ================================== Coronavirus (COVID-19) Are you at risk?  Are you at risk for the Coronavirus (COVID-19)?  To be considered HIGH RISK for Coronavirus (COVID-19), you have to meet the following criteria:  . Traveled to [place] have fever, cough, and shortness of breath within the last 2 weeks of travel OR . Been in close contact with a person diagnosed with COVID-19 within the last 2 weeks and have  . fever, cough,and shortness of breath .  . IF YOU DO NOT MEET THESE CRITERIA, YOU ARE CONSIDERED LOW RISK FOR COVID-19.  What to do if you are HIGH RISK for COVID-19?  . IMarland Kitchen you are having a medical emergency, call 911. . Seek medical care right away. Before you go to a doctor's office, urgent care or emergency department, .  call ahead and tell them about your recent travel, contact with someone diagnosed with COVID-19  .  and your symptoms.  . You should receive instructions from your physician's office regarding next steps of care.  . When you arrive at healthcare provider, tell the healthcare staff immediately you have returned from  . visiting [place] in [place] or [place] in the last two weeks or you have been in close contact with a person diagnosed with  . COVID-19 in the last 2 weeks.   . Tell the health care staff about your symptoms: fever, cough and shortness of breath. . After you have been seen by a medical provider, you will be either: o Tested for (COVID-19) and discharged home on quarantine except to seek medical care if  o symptoms worsen, and asked to  - Stay home and avoid contact with others until you get your results (4-5 days)  - Avoid travel on public transportation if possible (such as bus, train, or airplane) or o Sent to the  Emergency Department by EMS for evaluation, COVID-19 testing  and  o possible admission depending on your condition and test results.  What to do if you are LOW RISK for COVID-19?  Reduce your risk of any infection by using the same precautions used for avoiding the common cold or flu:  Marland Kitchen Wash your hands often with soap and warm water for at least 20 seconds.  If soap and water are not readily available,  . use an alcohol-based hand sanitizer with at least 60% alcohol.  . If coughing or sneezing, cover your mouth and nose by coughing or sneezing into the elbow areas of your shirt or coat, .  into a tissue or into your sleeve (not your hands). . Avoid shaking hands with others and consider head nods or verbal greetings only. . Avoid touching your eyes, nose, or mouth with unwashed hands.  . Avoid close contact with people who are sick. . Avoid places or events with large numbers of people in one location, like concerts or sporting events. . Carefully consider travel plans you have or are making. . If you are planning any travel outside or inside the [place], visit the CDC's Travelers' Health webpage for the latest health notices. . If you have some symptoms but not all symptoms, continue to monitor at home and seek medical attention  . if your symptoms worsen. . If you are having a medical emergency, call 911. >>>>>>>>>>>>>>>>>>>>>>> Preventive Care for Adults  A healthy lifestyle and preventive care can promote health and wellness. Preventive health guidelines for women include the following key practices.  A routine yearly physical is a good way to check with your health care provider about your health and preventive screening.   It is a chance to share any concerns and updates on your health and to receive a thorough exam.  Visit your dentist for a routine exam and preventive care every 6 months. Brush your teeth twice a day and floss once a day.   Good oral hygiene prevents tooth decay  and gum disease.  The frequency of eye exams is based on your age, health, family medical history, use of contact lenses, and other factors.   Follow your health care provider's recommendations for frequency of eye exams.  Eat a healthy diet. Foods like vegetables, fruits, whole grains, low-fat dairy products, and lean protein foods contain the nutrients   you need without too many calories. Decrease your intake of foods high in solid fats, added sugars, and salt.   Eat the right amount of calories for you. Get information about a proper diet from your health care provider, if necessary.  Regular physical exercise is one of the most important things you can do for your health. Most adults should get at least 150 minutes  of moderate-intensity exercise (any activity that increases your heart rate and causes you to sweat) each week. In addition,   most adults need muscle-strengthening exercises on 2 or more days a week.  Maintain a healthy weight. The body mass index (BMI) is a screening tool to identify possible weight problems. It provides   an estimate of body fat based on height and weight. Your health care provider can find your BMI and can help you achieve   or maintain a healthy weight. For adults 20 years and older:  A BMI below 18.5 is considered underweight.  A BMI of 18.5 to 24.9 is normal.  A BMI of 25 to 29.9 is considered overweight.  A BMI of 30 and above is considered obese.  Maintain normal blood lipids and cholesterol levels by exercising and minimizing your intake of saturated fat. Eat a balanced   diet with plenty of fruit and vegetables. Blood tests for lipids and cholesterol should begin at age 39 and be repeated every 5 years.    If your lipid or cholesterol levels are high, you are over 50, or you are at high risk for heart disease, you may need your  cholesterol levels checked more frequently. Ongoing high lipid and cholesterol levels should be treated  with medicines  if diet and exercise are not working.  If you smoke, find out from your health care provider how to quit. If you do not use tobacco, do not start.  Lung cancer screening is recommended for adults aged 65-80 years who are at high risk for developing lung cancer because   of a history of smoking. A yearly low-dose CT scan of the lungs is recommended for people who have at least a 30-pack-year   history of smoking and are a current smoker or have quit within the past 15 years. A pack year of smoking is smoking an   average of 1 pack of cigarettes a day for 1 year (for example: 1 pack a day for 30 years or 2 packs a day for 15 years).   Yearly screening should continue until the smoker has stopped smoking for at least 15 years. Yearly screening should be stopped   for people who develop a health problem that would prevent them from having lung cancer treatment.  If you are pregnant, do not drink alcohol. If you are breastfeeding, be very cautious about drinking alcohol. If you are not pregnant   and choose to drink alcohol, do not have more than 1 drink per day. One drink is considered to be 12 ounces (355 mL) of beer,   5 ounces (148 mL) of wine, or 1.5 ounces (44 mL) of liquor.  Avoid use of street drugs. Do not share needles with anyone. Ask for help if you need support or instructions   about stopping the use of drugs.  High blood pressure causes heart disease and increases the risk of stroke. Your blood pressure should be checked at least   every 1 to 2 years. Ongoing high blood pressure should be treated with medicines if weight loss and exercise do not work.  If you are 73-40 years old, ask your health care provider if you should take aspirin to prevent strokes.  Diabetes screening involves taking a blood sample to check your fasting blood sugar level. This should be done once every 3 years,   after age 64, if you are within normal weight and without risk factors  for diabetes. Testing should be considered  at a younger age or   be carried out more frequently if you are overweight and have at least 1 risk factor for diabetes.  Breast cancer screening is essential preventive care for women. You should practice "breast self-awareness." This means   understanding the normal appearance and feel of your breasts and may include breast self-examination. Any changes detected,   no matter how small, should be reported to a health care provider. Women in their 26s and 30s should have a clinical   breast exam (CBE) by a health care provider as part of a regular health exam every 1 to 3 years. After age 90, women should   have a CBE every year. Starting at age 25, women should consider having a mammogram (breast X-ray test) every year.   Women who have a family history of breast cancer should talk to their health care provider about genetic screening.   Women at a high risk of breast cancer should talk to their health care providers about having an MRI and a   mammogram every year.  Breast cancer gene (BRCA)-related cancer risk assessment is recommended for women who have family members with   BRCA-related cancers. BRCA-related cancers include breast, ovarian, tubal, and peritoneal cancers. Having family members   with these cancers may be associated with an increased risk for harmful changes (mutations) in the breast cancer   genes BRCA1 and BRCA2. Results of the assessment will determine the need for genetic counseling and BRCA1 and BRCA2 testing.  Routine pelvic exams to screen for cancer are no longer recommended for nonpregnant women who are considered low r  isk for cancer of the pelvic organs (ovaries, uterus, and vagina) and who do not have symptoms. Ask your health care provider   if a screening pelvic exam is right for you.  If you have had past treatment for cervical cancer or a condition that could lead to cancer, you need Pap tests and  screening   for cancer for at least 20 years after your treatment. If Pap tests have been discontinued, your risk factors (such as having a   new sexual partner) need to be reassessed to determine if screening should be resumed. Some women have medical problems   that increase the chance of getting cervical cancer. In these cases, your health care provider may recommend more   frequent screening and Pap tests.  The HPV test is an additional test that may be used for cervical cancer screening. The HPV test looks for the virus that can   cause the cell changes on the cervix. The cells collected during the Pap test can be tested for HPV. The HPV test could be used to   screen women aged 74 years and older, and should be used in women of any age who have unclear Pap test results.   After the age of 49, women should have HPV testing at the same frequency as a Pap test.  Colorectal cancer can be detected and often prevented. Most routine colorectal cancer screening begins at the age of 61 years   and continues through age 35 years. However, your health care provider may recommend screening at an earlier age if you have   risk factors for colon cancer. On a yearly basis, your health care provider may provide home test kits to check for hidden blood   in the stool. Use of a small camera at the end of a tube, to directly examine the colon (sigmoidoscopy or colonoscopy),  can detect   the earliest forms of colorectal cancer. Talk to your health care provider about this at age 75, when routine screening begins.    Direct exam of the colon should be repeated every 5-10 years through age 55 years, unless early forms of pre-cancerous   polyps or small growths are found.  People who are at an increased risk for hepatitis B should be screened for this virus. You are considered at   high risk for hepatitis B if:  You were born in a country where hepatitis B occurs often. Talk with your health care  provider about which countries are   considered high risk.  Your parents were born in a high-risk country and you have not received a shot to protect against   hepatitis B (hepatitis B vaccine).  You have HIV or AIDS.  You use needles to inject street drugs.  You live with, or have sex with, someone who has hepatitis B.  You get hemodialysis treatment.  You take certain medicines for conditions like cancer, organ transplantation, and autoimmune conditions.  Hepatitis C blood testing is recommended for all people born from [birth year] through 1967 and any individual with known   risks for hepatitis C.  Practice safe sex. Use condoms and avoid high-risk sexual practices to reduce the spread of sexually transmitted   infections (STIs). STIs include gonorrhea, chlamydia, syphilis, trichomonas, herpes, HPV, and human immunodeficiency   virus (HIV). Herpes, HIV, and HPV are viral illnesses that have no cure. They can result in disability, cancer, and death.  You should be screened for sexually transmitted illnesses (STIs) including gonorrhea and chlamydia if:  You are sexually active and are younger than 24 years.  You are older than 24 years and your health care provider tells you that you are at risk for this type of infection.  Your sexual activity has changed since you were last screened and you are at an increased risk for chlamydia or   gonorrhea. Ask your health care provider if you are at risk.  If you are at risk of being infected with HIV, it is recommended that you take a prescription medicine daily to prevent   HIV infection. This is called preexposure prophylaxis (PrEP). You are considered at risk if:  You are a heterosexual woman, are sexually active, and are at increased risk for HIV infection.  You take drugs by injection.  You are sexually active with a partner who has HIV.  Talk with your health care provider about whether you are at high risk of being infected  with HIV. If you choose to begin   PrEP, you should first be tested for HIV. You should then be tested every 3 months for as long as you are taking PrEP.  Osteoporosis is a disease in which the bones lose minerals and strength with aging. This can result in serious bone fractures o  r breaks. The risk of osteoporosis can be identified using a bone density scan. Women ages 34 years and over and women at   risk for fractures or osteoporosis should discuss screening with their health care providers. Ask your health care provider   whether you should take a calcium supplement or vitamin D to reduce the rate of osteoporosis.  Menopause can be associated with physical symptoms and risks. Hormone replacement therapy is available to   decrease symptoms and risks. You should talk to your health care provider about whether hormone replacement therapy   is right for  you.  Use sunscreen. Apply sunscreen liberally and repeatedly throughout the day. You should seek shade when your shadow is   shorter than you. Protect yourself by wearing long sleeves, pants, a wide-brimmed hat, and sunglasses year round,   whenever you are outdoors.  Once a month, do a whole body skin exam, using a mirror to look at the skin on your back. Tell your health care provider of new   moles, moles that have irregular borders, moles that are larger than a pencil eraser, or moles that have changed in shape or color.  Stay current with required vaccines (immunizations).  Influenza vaccine. All adults should be immunized every year.  Tetanus, diphtheria, and acellular pertussis (Td, Tdap) vaccine. Pregnant women should receive 1 dose of Tdap vaccine   during each pregnancy. The dose should be obtained regardless of the length of time since the last dose. Immunization is   preferred during the 27th-36th week of gestation. An adult who has not previously received Tdap or who does not know   her vaccine status should  receive 1 dose of Tdap. This initial dose should be followed by tetanus and diphtheria toxoids   (Td) booster doses every 10 years. Adults with an unknown or incomplete history of completing a 3-dose immunization   series with Td-containing vaccines should begin or complete a primary immunization series including a Tdap dose.   Adults should receive a Td booster every 10 years.  Varicella vaccine. An adult without evidence of immunity to varicella should receive 2 doses or a second dose if she has   previously received 1 dose. Pregnant females who do not have evidence of immunity should receive the first dose   after pregnancy. This first dose should be obtained before leaving the health care facility. The second dose should be   obtained 4-8 weeks after the first dose.  Human papillomavirus (HPV) vaccine. Females aged 13-26 years who have not received the vaccine previously should   obtain the 3-dose series. The vaccine is not recommended for use in pregnant females. However, pregnancy testing is not   needed before receiving a dose. If a female is found to be pregnant after receiving a dose, no treatment is needed.   In that case, the remaining doses should be delayed until after the pregnancy. Immunization is recommended for any person   with an immunocompromised condition through the age of 52 years if she did not get any or all doses earlier.   During the 3-dose series, the second dose should be obtained 4-8 weeks after the first dose. The third dose should be obtained   24 weeks after the first dose and 16 weeks after the second dose.  Zoster vaccine. One dose is recommended for adults aged 30 years or older unless certain conditions are present.  Measles, mumps, and rubella (MMR) vaccine. Adults born before [birth year] generally are considered immune to measles and   mumps. Adults born in [birth year] or later should have 1 or more doses of MMR vaccine unless there is a contraindication to  the   vaccine or there is laboratory evidence of immunity to each of the three diseases. A routine second dose of MMR vaccine   should be obtained at least 28 days after the first dose for students attending postsecondary schools, health care workers   or international travelers. People who received inactivated measles vaccine or an unknown type of measles vaccine during   1963-1967 should receive 2 doses of MMR vaccine.  People who received inactivated mumps vaccine or an unknown type of   mumps vaccine before 1979 and are at high risk for mumps infection should consider immunization with 2 doses of MMR   vaccine. For females of childbearing age, rubella immunity should be determined. If there is no evidence of immunity,   females who are not pregnant should be vaccinated. If there is no evidence of immunity, females who are pregnant should   delay immunization until after pregnancy. Unvaccinated health care workers born before [birth year] who lack laboratory   evidence of measles, mumps, or rubella immunity or laboratory confirmation of disease should consider measles and   mumps immunization with 2 doses of MMR vaccine or rubella immunization with 1 dose of MMR vaccine.  Pneumococcal 13-valent conjugate (PCV13) vaccine. When indicated, a person who is uncertain of her immunization history   and has no record of immunization should receive the PCV13 vaccine. An adult aged 44 years or older who has certain medical   conditions and has not been previously immunized should receive 1 dose of PCV13 vaccine. This PCV13 should be followed with   a dose of pneumococcal polysaccharide (PPSV23) vaccine. The PPSV23 vaccine dose should be obtained at least 8 weeks after   the dose of PCV13 vaccine. An adult aged 57 years or older who has certain medical conditions and previously received 1 or   more doses of PPSV23 vaccine should receive 1 dose of PCV13. The PCV13 vaccine dose should be obtained 1 or  more years   after the last PPSV23 vaccine dose.  Pneumococcal polysaccharide (PPSV23) vaccine. When PCV13 is also indicated, PCV13 should be obtained first.   All adults aged 48 years and older should be immunized. An adult younger than age 72 years who has certain medical   conditions should be immunized. Any person who resides in a nursing home or long-term care facility should be   immunized. An adult smoker should be immunized. People with an immunocompromised condition and certain   other conditions should receive both PCV13 and PPSV23 vaccines. People with human immunodeficiency virus (HIV)   infection should be immunized as soon as possible after diagnosis. Immunization during chemotherapy or radiation   therapy should be avoided. Routine use of PPSV23 vaccine is not recommended for American Indians, [place]   or people younger than 65 years unless there are medical conditions that require PPSV23 vaccine. When indicated,   people who have unknown immunization and have no record of immunization should receive PPSV23 vaccine.   One-time revaccination 5 years after the first dose of PPSV23 is recommended for people aged 19-64 years who have   chronic kidney failure, nephrotic syndrome, asplenia, or immunocompromised conditions. People who received 1-2 doses   of PPSV23 before age 54 years should receive another dose of PPSV23 vaccine at age 19 years or later if at least 5 years   have passed since the previous dose. Doses of PPSV23 are not needed for people immunized with PPSV23 at or after   age 61 years.  Meningococcal vaccine. Adults with asplenia or persistent complement component deficiencies should receive 2 doses of   quadrivalent meningococcal conjugate (MenACWY-D) vaccine. The doses should be obtained at least 2 months apart.   Microbiologists working with certain meningococcal bacteria, [employer] recruits, people at risk during an outbreak  and   people who  travel to or live in countries with a high rate of meningitis should be immunized. A Market researcher  up through age 79 years who is living in a residence hall should receive a dose if she did not receive a dose on or after her   16th birthday. Adults who have certain high-risk conditions should receive one or more doses of vaccine.  Hepatitis A vaccine. Adults who wish to be protected from this disease, have certain high-risk conditions, work with   hepatitis A-infected animals, work in hepatitis A research labs, or travel to or work in countries with a high rate of   hepatitis A should be immunized. Adults who were previously unvaccinated and who anticipate close contact with   an international adoptee during the first 60 days after arrival in the [place] from a country with a high rate   of hepatitis A should be immunized.  Hepatitis B vaccine. Adults who wish to be protected from this disease, have certain high-risk conditions, may be   exposed to blood or other infectious body fluids, are household contacts or sex partners of hepatitis B positive people,   are clients or workers in certain care facilities, or travel to or work in countries with a high rate of   hepatitis B should be immunized.  Haemophilus influenzae type b (Hib) vaccine. A previously unvaccinated person with asplenia or sickle cell disease   or having a scheduled splenectomy should receive 1 dose of Hib vaccine. Regardless of previous immunization, a recipient   of a hematopoietic stem cell transplant should receive a 3-dose series 6-12 months after her successful transplant.   Hib vaccine is not recommended for adults with HIV infection. Preventive Services / Frequency  Ages 59 to 39 years  Blood pressure check.  Lipid and cholesterol check.  Clinical breast exam.** / Every 3 years for women in their 37s and 9s.  BRCA-related cancer risk assessment.** / For women who have family  members with a BRCA-related cancer   (breast, ovarian, tubal, or peritoneal cancers).  Pap test.** / Every 2 years from ages 19 through 23. Every 3 years starting at age 22 through age 36 or 83 with a   history of 3 consecutive normal Pap tests.  HPV screening.** / Every 3 years from ages 43 through ages 24 to 47 with a history of 3 consecutive normal Pap tests.  Hepatitis C blood test.** / For any individual with known risks for hepatitis C.  Skin self-exam. / Monthly.  Influenza vaccine. / Every year.  Tetanus, diphtheria, and acellular pertussis (Tdap, Td) vaccine.** / Consult your health care provider. Pregnant women   should receive 1 dose of Tdap vaccine during each pregnancy. 1 dose of Td every 10 years.  Varicella vaccine.** / Consult your health care provider. Pregnant females who do not have evidence of immunity should   receive the first dose after pregnancy.  HPV vaccine. / 3 doses over 6 months, if 100 and younger. The vaccine is not recommended for use in pregnant females.   However, pregnancy testing is not needed before receiving a dose.  Measles, mumps, rubella (MMR) vaccine.** / You need at least 1 dose of MMR if you were born in 1957 or later.   You may also need a 2nd dose. For females of childbearing age, rubella immunity should be determined. If there is   no evidence of immunity, females who are not pregnant should be vaccinated. If there is no evidence of immunity,   females who are pregnant should delay immunization until after pregnancy.  Pneumococcal 13-valent conjugate (PCV13) vaccine.** /  Consult your health care provider.  Pneumococcal polysaccharide (PPSV23) vaccine.** / 1 to 2 doses if you smoke cigarettes or if you have certain conditions.  Meningococcal vaccine.** / 1 dose if you are age 61 to 47 years and a Market researcher living in a residence hall  or   have one of several medical conditions, you need to get vaccinated against  meningococcal disease. You may also need additional booster doses.  Hepatitis A vaccine.** / Consult your health care provider.  Hepatitis B vaccine.** / Consult your health care provider.  Haemophilus influenzae type b (Hib) vaccine.** / Consult your health care provider. +++++++++++++++++++++++++++++++ Recommend Adult Low Dose Aspirin or  coated  Aspirin 81 mg daily  To reduce risk of Colon Cancer 40 %,  Skin Cancer 26 % ,  Melanoma 46%  and  Pancreatic cancer 60% +++++++++++++++++++++++++++++++ Vitamin D goal  is between 70-100.  Please make sure that you are taking your Vitamin D as directed.  It is very important as a natural anti-inflammatory  helping hair, skin, and nails, as well as reducing stroke and heart attack risk.  It helps your bones and helps with mood. It also decreases numerous cancer risks so please take it as directed.  Low Vit D is associated with a 200-300% higher risk for CANCER  and 200-300% higher risk for HEART   ATTACK  &  STROKE.   .....................................Marland Kitchen It is also associated with higher death rate at younger ages,  autoimmune diseases like Rheumatoid arthritis, Lupus, Multiple Sclerosis.    Also many other serious conditions, like depression, Alzheimer's Dementia, infertility, muscle aches, fatigue, fibromyalgia - just to name a few. ++++++++++++++++++++ Recommend the book "The END of DIETING" by Dr Excell Seltzer  & the book "The END of DIABETES " by Dr Excell Seltzer At [email or web address] - get book & Audio CD's    Being diabetic has a  300% increased risk for heart attack, stroke, cancer, and alzheimer- type vascular dementia. It is very important that you work harder with diet by avoiding all foods that are white. Avoid white rice (brown & wild rice is OK), white potatoes (sweetpotatoes in moderation is OK), White bread or wheat bread or anything made out of white flour like bagels, donuts, rolls, buns, biscuits, cakes, pastries, cookies, pizza  crust, and pasta (made from white flour & egg whites) - vegetarian pasta or spinach or wheat pasta is OK. Multigrain breads like Arnold's or Pepperidge [employer], or multigrain sandwich thins or flatbreads.  Diet, exercise and weight loss can reverse and cure diabetes in the early stages.  Diet, exercise and weight loss is very important in the control and prevention of complications of diabetes which affects every system in your body, ie. Brain - dementia/stroke, eyes - glaucoma/blindness, heart - heart attack/heart failure, kidneys - dialysis, stomach - gastric paralysis, intestines - malabsorption, nerves - severe painful neuritis, circulation - gangrene & loss of a leg(s), and finally cancer and Alzheimers.    I recommend avoid fried & greasy foods,  sweets/candy, white rice (brown or wild rice or Quinoa is OK), white potatoes (sweet potatoes are OK) - anything made from white flour - bagels, doughnuts, rolls, buns, biscuits,white and wheat breads, pizza crust and traditional pasta made of white flour & egg white(vegetarian pasta or spinach or wheat pasta is OK).  Multi-grain bread is OK - like multi-grain flat bread or sandwich thins. Avoid alcohol in excess. Exercise is also important.    Eat all the vegetables you  want - avoid meat, especially red meat and dairy - especially cheese.  Cheese is the most concentrated form of trans-fats which is the worst thing to clog up our arteries. Veggie cheese is OK which can be found in the fresh produce section at [employer] or [employer] or [employer]  ++++++++++++++++++++ DASH Eating Plan  DASH stands for "Dietary Approaches to Stop Hypertension."   The DASH eating plan is a healthy eating plan that has been shown to reduce high blood pressure (hypertension). Additional health benefits may include reducing the risk of type 2 diabetes mellitus, heart disease, and stroke. The DASH eating plan may also help with weight loss. WHAT DO I NEED TO KNOW ABOUT THE DASH  EATING PLAN? For the DASH eating plan, you will follow these general guidelines:  Choose foods with a percent daily value for sodium of less than 5% (as listed on the food label).  Use salt-free seasonings or herbs instead of table salt or sea salt.  Check with your health care provider or pharmacist before using salt substitutes.  Eat lower-sodium products, often labeled as "lower sodium" or "no salt added."  Eat fresh foods.  Eat more vegetables, fruits, and low-fat dairy products.  Choose whole grains. Look for the word "whole" as the first word in the ingredient list.  Choose fish   Limit sweets, desserts, sugars, and sugary drinks.  Choose heart-healthy fats.  Eat veggie cheese   Eat more home-cooked food and less restaurant, buffet, and fast food.  Limit fried foods.  Cook foods using methods other than frying.  Limit canned vegetables. If you do use them, rinse them well to decrease the sodium.  When eating at a restaurant, ask that your food be prepared with less salt, or no salt if possible.                      WHAT FOODS CAN I EAT? Read Dr Fara Olden Fuhrman's books on The End of Dieting & The End of Diabetes  Grains Whole grain or whole wheat bread. Brown rice. Whole grain or whole wheat pasta. Quinoa, bulgur, and whole grain cereals. Low-sodium cereals. Corn or whole wheat flour tortillas. Whole grain cornbread. Whole grain crackers. Low-sodium crackers.  Vegetables Fresh or frozen vegetables (raw, steamed, roasted, or grilled). Low-sodium or reduced-sodium tomato and vegetable juices. Low-sodium or reduced-sodium tomato sauce and paste. Low-sodium or reduced-sodium canned vegetables.   Fruits All fresh, canned (in natural juice), or frozen fruits.  Protein Products  All fish and seafood.  Dried beans, peas, or lentils. Unsalted nuts and seeds. Unsalted canned beans.  Dairy Low-fat dairy products, such as skim or 1% milk, 2% or reduced-fat cheeses, low-fat  ricotta or cottage cheese, or plain low-fat yogurt. Low-sodium or reduced-sodium cheeses.  Fats and Oils Tub margarines without trans fats. Light or reduced-fat mayonnaise and salad dressings (reduced sodium). Avocado. Safflower, olive, or canola oils. Natural peanut or almond butter.  Other Unsalted popcorn and pretzels. The items listed above may not be a complete list of recommended foods or beverages. Contact your dietitian for more options.  +++++++++++++++++++  WHAT FOODS ARE NOT RECOMMENDED? Grains/ White flour or wheat flour White bread. White pasta. White rice. Refined cornbread. Bagels and croissants. Crackers that contain trans fat.  Vegetables  Creamed or fried vegetables. Vegetables in a . Regular canned vegetables. Regular canned tomato sauce and paste. Regular tomato and vegetable juices.  Fruits Dried fruits. Canned fruit in light or heavy syrup. Fruit  juice.  Meat and Other Protein Products Meat in general - RED meat & White meat.  Fatty cuts of meat. Ribs, chicken wings, all processed meats as bacon, sausage, bologna, salami, fatback, hot dogs, bratwurst and packaged luncheon meats.  Dairy Whole or 2% milk, cream, half-and-half, and cream cheese. Whole-fat or sweetened yogurt. Full-fat cheeses or blue cheese. Non-dairy creamers and whipped toppings. Processed cheese, cheese spreads, or cheese curds.  Condiments Onion and garlic salt, seasoned salt, table salt, and sea salt. Canned and packaged gravies. Worcestershire sauce. Tartar sauce. Barbecue sauce. Teriyaki sauce. Soy sauce, including reduced sodium. Steak sauce. Fish sauce. Oyster sauce. Cocktail sauce. Horseradish. Ketchup and mustard. Meat flavorings and tenderizers. Bouillon cubes. Hot sauce. Tabasco sauce. Marinades. Taco seasonings. Relishes.  Fats and Oils Butter, stick margarine, lard, shortening and bacon fat. Coconut, palm kernel, or palm oils. Regular salad dressings.  Pickles and olives. Salted  popcorn and pretzels.  The items listed above may not be a complete list of foods and beverages to avoid.

## 2020-01-03 ENCOUNTER — Other Ambulatory Visit: Payer: Self-pay | Admitting: Adult Health Nurse Practitioner

## 2020-01-03 LAB — COMPLETE METABOLIC PANEL WITH GFR
AG Ratio: 1.6 (calc) (ref 1.0–2.5)
ALT: 25 U/L (ref 6–29)
AST: 14 U/L (ref 10–30)
Albumin: 4.2 g/dL (ref 3.6–5.1)
Alkaline phosphatase (APISO): 80 U/L (ref 31–125)
BUN: 13 mg/dL (ref 7–25)
CO2: 25 mmol/L (ref 20–32)
Calcium: 9.4 mg/dL (ref 8.6–10.2)
Chloride: 109 mmol/L (ref 98–110)
Creat: 0.86 mg/dL (ref 0.50–1.10)
GFR, Est African American: 108 mL/min/{1.73_m2} (ref >=60)
GFR, Est Non African American: 93 mL/min/{1.73_m2} (ref >=60)
Globulin: 2.7 g/dL (calc) (ref 1.9–3.7)
Glucose, Bld: 86 mg/dL (ref 65–99)
Potassium: 4.5 mmol/L (ref 3.5–5.3)
Sodium: 142 mmol/L (ref 135–146)
Total Bilirubin: 0.3 mg/dL (ref 0.2–1.2)
Total Protein: 6.9 g/dL (ref 6.1–8.1)

## 2020-01-03 LAB — URINALYSIS W MICROSCOPIC + REFLEX CULTURE
Bilirubin Urine: NEGATIVE
Glucose, UA: NEGATIVE
Hgb urine dipstick: NEGATIVE
Hyaline Cast: NONE SEEN /LPF
Ketones, ur: NEGATIVE
Nitrites, Initial: NEGATIVE
Protein, ur: NEGATIVE
RBC / HPF: NONE SEEN /HPF (ref 0–2)
Specific Gravity, Urine: 1.027 (ref 1.001–1.03)
pH: <=5 (ref 5.0–8.0)

## 2020-01-03 LAB — URINE CULTURE
MICRO NUMBER:: 10731566
SPECIMEN QUALITY:: ADEQUATE

## 2020-01-03 LAB — CBC WITH DIFFERENTIAL/PLATELET
Absolute Monocytes: 475 cells/uL (ref 200–950)
Basophils Absolute: 65 cells/uL (ref 0–200)
Basophils Relative: 0.6 %
Eosinophils Absolute: 76 cells/uL (ref 15–500)
Eosinophils Relative: 0.7 %
HCT: 44.6 % (ref 35.0–45.0)
Hemoglobin: 15.3 g/dL (ref 11.7–15.5)
Lymphs Abs: 3013 cells/uL (ref 850–3900)
MCH: 30.5 pg (ref 27.0–33.0)
MCHC: 34.3 g/dL (ref 32.0–36.0)
MCV: 89 fL (ref 80.0–100.0)
MPV: 11.2 fL (ref 7.5–12.5)
Monocytes Relative: 4.4 %
Neutro Abs: 7171 cells/uL (ref 1500–7800)
Neutrophils Relative %: 66.4 %
Platelets: 295 10*3/uL (ref 140–400)
RBC: 5.01 10*6/uL (ref 3.80–5.10)
RDW: 13.4 % (ref 11.0–15.0)
Total Lymphocyte: 27.9 %
WBC: 10.8 10*3/uL (ref 3.8–10.8)

## 2020-01-03 LAB — IRON, TOTAL/TOTAL IRON BINDING CAP: Iron: 71 ug/dL (ref 40–190)

## 2020-01-03 LAB — VITAMIN D 25 HYDROXY (VIT D DEFICIENCY, FRACTURES): Vit D, 25-Hydroxy: 16 ng/mL — ABNORMAL LOW (ref 30–100)

## 2020-01-03 LAB — TSH: TSH: 0.99 mIU/L

## 2020-01-03 LAB — HEMOGLOBIN A1C
Hgb A1c MFr Bld: 5.2 % of total Hgb (ref <5.7)
Mean Plasma Glucose: 103 (calc)
eAG (mmol/L): 5.7 (calc)

## 2020-01-03 LAB — CULTURE INDICATED

## 2020-01-03 LAB — VITAMIN B12: Vitamin B-12: 344 pg/mL (ref 200–1100)

## 2020-01-03 MED ORDER — CHOLECALCIFEROL 1.25 MG (50000 UT) PO CAPS: ORAL_CAPSULE | ORAL | 0 refills | Status: DC

## 2020-01-15 ENCOUNTER — Encounter: Payer: Self-pay | Admitting: Adult Health Nurse Practitioner

## 2020-01-15 ENCOUNTER — Ambulatory Visit (INDEPENDENT_AMBULATORY_CARE_PROVIDER_SITE_OTHER): Payer: No Typology Code available for payment source | Admitting: Adult Health Nurse Practitioner

## 2020-01-15 ENCOUNTER — Other Ambulatory Visit: Payer: Self-pay

## 2020-01-15 VITALS — BP 120/82 | HR 90 | Temp 96.8°F | Wt 192.0 lb

## 2020-01-15 DIAGNOSIS — E663 Overweight: Secondary | ICD-10-CM

## 2020-01-15 DIAGNOSIS — Z7689 Persons encountering health services in other specified circumstances: Secondary | ICD-10-CM

## 2020-01-15 MED ORDER — PHENTERMINE HCL 37.5 MG PO TABS: ORAL_TABLET | ORAL | 5 refills | Status: DC

## 2020-01-15 NOTE — Progress Notes (Signed)
24 y.o.female presents for a follow up after being on phentermine for weight loss.   While on the medication they have lost 6 lbs since last visit which was 2 weeks ago.  They deny palpitations, anxiety, trouble sleeping, elevated BP.   BP Readings from Last 3 Encounters:  01/15/20 120/82  01/01/20 112/80  12/03/19 134/86    BMI is Body mass index is 32.96 kg/m., she is working on diet and exercise. Wt Readings from Last 3 Encounters:  01/15/20 192 lb (87.1 kg)  01/01/20 198 lb (89.8 kg)  12/03/19 195 lb (88.5 kg)    Typical breakfast:  Skips breakfast most days Typical lunch: Varies salad, left overs.  Has reduced fast food intake.   Typical dinner: Chicken vegetables typically.    Medications:  Current Outpatient Medications (Endocrine & Metabolic):  Marland Kitchen  Norethin-Eth Estrad-Fe Biphas (LO LOESTRIN FE PO), Take by mouth daily.      Current Outpatient Medications (Other):  Marland Kitchen  COLLAGEN PO, Take by mouth daily. .  Cholecalciferol 1.25 MG (50000 UT) capsule, Take one tablet by mouth three days a week for twelve weeks. (Patient not taking: Reported on 01/15/2020) .  phentermine (ADIPEX-P) 37.5 MG tablet, Take 1/2 to 1 tablet each morning for Dieting & Weight Loss  ROS: All negative except for above  Physical exam: Vitals:   01/15/20 1127  BP: 120/82  Pulse: 90  Temp: (!) 96.8 F (36 C)  SpO2: 97%   Physical Exam Constitutional:      Appearance: Normal appearance.  HENT:     Mouth/Throat:     Mouth: Mucous membranes are moist.  Eyes:     Extraocular Movements: Extraocular movements intact.     Conjunctiva/sclera: Conjunctivae normal.     Pupils: Pupils are equal, round, and reactive to light.  Cardiovascular:     Rate and Rhythm: Normal rate and regular rhythm.  Pulmonary:     Effort: Pulmonary effort is normal.     Breath sounds: Normal breath sounds.  Abdominal:     General: Bowel sounds are normal.     Palpations: Abdomen is soft.  Musculoskeletal:         General: Normal range of motion.  Skin:    General: Skin is warm and dry.  Neurological:     General: No focal deficit present.     Mental Status: She is alert. Mental status is at baseline.  Psychiatric:        Mood and Affect: Mood normal.        Behavior: Behavior normal.        Thought Content: Thought content normal.        Judgment: Judgment normal.     Assessment: Kelsey Wheeler was seen today for follow-up.  Diagnoses and all orders for this visit:  Overweight (BMI 25.0-29.9) Discussed dietary and exercise modifications -     phentermine (ADIPEX-P) 37.5 MG tablet; Take 1/2 to 1 tablet each morning for Dieting & Weight Loss  Encounter for weight management Follow up in 4 weeks. Discussed dietary modifications Encouraged to eat breakfast, continue to improve food choices.  Plan: She will work on meal planning, intentional eating, and increasing water.  She has been instructed to work up to a goal of 150 minutes of combined cardio and strengthening exercise per week for weight loss and overall health benefits. We discussed the following Behavioral Modification Strategies today: increasing lean protein intake, decreasing simple carbohydrates, increasing vegetables, increase H20 intake, decrease eating out, no skipping  meals, work on meal planning and easy cooking plans, keeping healthy foods in the home, and planning for success.   She has agreed to follow-up with our clinic in 4 weeks. She was informed of the importance of frequent follow-up visits to maximize success with intensive lifestyle modifications for her multiple health conditions.  Medication refill: phentermine 37.5mg  tablet, daily in am, prior to first meal.   Future Appointments  Date Time Provider Department Center  02/26/2020 11:30 AM Elder Negus, NP [facility] None  01/01/2021  9:00 AM Elder Negus, NP [facility] None

## 2020-01-18 MED ORDER — PHENTERMINE HCL 37.5 MG PO TABS: ORAL_TABLET | ORAL | 0 refills | Status: DC

## 2020-02-20 ENCOUNTER — Encounter: Payer: Self-pay | Admitting: Adult Health

## 2020-02-20 ENCOUNTER — Other Ambulatory Visit: Payer: Self-pay

## 2020-02-20 ENCOUNTER — Ambulatory Visit (INDEPENDENT_AMBULATORY_CARE_PROVIDER_SITE_OTHER): Payer: No Typology Code available for payment source | Admitting: Adult Health

## 2020-02-20 VITALS — Temp 97.3°F

## 2020-02-20 DIAGNOSIS — U071 COVID-19: Secondary | ICD-10-CM

## 2020-02-20 DIAGNOSIS — Z20822 Contact with and (suspected) exposure to covid-19: Secondary | ICD-10-CM

## 2020-02-20 LAB — POC COVID19 BINAXNOW: SARS Coronavirus 2 Ag: POSITIVE — AB

## 2020-02-20 MED ORDER — PREDNISONE 20 MG PO TABS: ORAL_TABLET | ORAL | 0 refills | Status: DC

## 2020-02-20 NOTE — Progress Notes (Signed)
Virtual Visit via Telephone Note  I connected with Kelsey Wheeler on 02/20/20 at 11:30 AM EDT by telephone and verified that I am speaking with the correct person using two identifiers.  Location: Patient: car in parking lot Provider: [facility] office    I discussed the limitations, risks, security and privacy concerns of performing an evaluation and management service by telephone and the availability of in person appointments. I also discussed with the patient that there may be a patient responsible charge related to this service. The patient expressed understanding and agreed to proceed.   History of Present Illness:  Temp (!) 97.3 F (36.3 C)   SpO2 97%   24 y.o. with hx of overweight and vitamin D contacted office due to URI sx and was screened by rapid covid 19 test and found to be positive. OV was converted to televisit. She is unvaccinated. No known sick contacts.   Reprots sx began 4 days ago with sinus pressure, headache, myalgias, fever. Temp of 100.6 yesterday, taking tylenol and advil PM. No fever today, sx are improved, currently reports mainly nasal congestion/drainage (yellow), occasional productive cough. Denies sore throat, current headache, fever/chills, rash, CP, dyspnea, wheezing, fatigue, GI sx.   Has leftover hycodan cough syrup, hasn't needed to use  Not vaccinated     Allergies: No Known Allergies   Medical History:  has Hx of thyroiditis; ADD (attention deficit disorder); Overweight (BMI 25.0-29.9); Vitamin D deficiency; Elevated LFTs; Encounter for induction of labor; SVD (spontaneous vaginal delivery); Postpartum care following vaginal delivery 9/18; Perineal laceration, second degree; Maternal anemia, with delivery; and COVID-19 (02/19/2020) on their problem list.   Social History:   reports that she has never smoked. She has never used smokeless tobacco. She reports previous alcohol use. She reports that she does not use  drugs.   Observations/Objective:  General : Well sounding patient in no apparent distress HEENT: no hoarseness, no cough for duration of visit Lungs: speaks in complete sentences, no audible wheezing, no apparent distress Neurological: alert, oriented x 3 Psychiatric: pleasant, judgement appropriate   Assessment and Plan:  COVID-19  Covid 19 positive per rapid screening test in unvaccinated Risk factors include: HBC, advised bASA to prevent clots, ambulate or calf exercises every 2 hours Currently with very mild sx Suggested symptomatic OTC remedies and immune support with vit D, vit C, zinc supplements Nasal steroids, oral steroids offered Regular breathing exercises, proning if getting short of breath Take tylenol/ibuprofen PRN temp 101+ Push hydration Sx supportive therapy Follow up via mychart or telephone if needed Advised patient obtain O2 monitor; contact office if trending down, present to ED if persistently <88% or with severe dyspnea, any CP, fever uncontrolled by tylenol, confusion, sudden decline Should remain in isolation until at least 10 days from onset of sx, 24-48 hours fever free without tylenol, sx such as cough are improved.  Written work note to excuse and mailed to patient per her request   Follow Up Instructions:    I discussed the assessment and treatment plan with the patient. The patient was provided an opportunity to ask questions and all were answered. The patient agreed with the plan and demonstrated an understanding of the instructions.   The patient was advised to call back or seek an in-person evaluation if the symptoms worsen or if the condition fails to improve as anticipated.  I provided 20 minutes of non-face-to-face time during this encounter.   Dan Maker, NP

## 2020-02-26 ENCOUNTER — Ambulatory Visit: Payer: No Typology Code available for payment source | Admitting: Adult Health Nurse Practitioner

## 2020-03-24 NOTE — Progress Notes (Signed)
Weight Management Follow Up  24 y.o.female presents for a follow up after being on phentermine 37.5mg  for weight loss.   While on the medication they have lost 6 lbs since last visit which was 2 weeks ago.  She reports covid diagnosis since last OV and she was not taking phentermine during this time.  Deny palpitations, anxiety, trouble sleeping, elevated BP.   BP Readings from Last 3 Encounters:  01/15/20 120/82  01/01/20 112/80  12/03/19 134/86    BMI is Body mass index is 31.86 kg/m., she is working on diet and exercise. Wt Readings from Last 3 Encounters:  03/25/20 185 lb 9.6 oz (84.2 kg)  01/15/20 192 lb (87.1 kg)  01/01/20 198 lb (89.8 kg)   Fever chills muscle aches, cough, loss, taste & smell COVID-19 positive on 02/20/20.  Typical breakfast:  Skips breakfast most days Typical lunch: Varies salad, left overs.  Has reduced fast food intake.   Typical dinner: Chicken vegetables typically.   Water intake: admits not meeting goal.    Medications:  Current Outpatient Medications (Endocrine & Metabolic):  Marland Kitchen  Norethin-Eth Estrad-Fe Biphas (LO LOESTRIN FE PO), Take by mouth daily.      Current Outpatient Medications (Other):  Marland Kitchen  Cholecalciferol 1.25 MG (50000 UT) capsule, Take one tablet by mouth three days a week for twelve weeks. .  COLLAGEN PO, Take by mouth daily. .  phentermine (ADIPEX-P) 37.5 MG tablet, Take 1/2 to 1 tablet each morning for Dieting & Weight Loss  ROS: All negative except for above  Physical exam: There were no vitals filed for this visit. Physical Exam Constitutional:      Appearance: Normal appearance.  HENT:     Mouth/Throat:     Mouth: Mucous membranes are moist.  Eyes:     Extraocular Movements: Extraocular movements intact.     Conjunctiva/sclera: Conjunctivae normal.     Pupils: Pupils are equal, round, and reactive to light.  Cardiovascular:     Rate and Rhythm: Normal rate and regular rhythm.  Pulmonary:     Effort: Pulmonary effort  is normal.     Breath sounds: Normal breath sounds.  Abdominal:     General: Bowel sounds are normal.     Palpations: Abdomen is soft.  Musculoskeletal:        General: Normal range of motion.  Skin:    General: Skin is warm and dry.  Neurological:     General: No focal deficit present.     Mental Status: She is alert. Mental status is at baseline.  Psychiatric:        Mood and Affect: Mood normal.        Behavior: Behavior normal.        Thought Content: Thought content normal.        Judgment: Judgment normal.     Assessment: Skylan was seen today for follow-up.  Diagnoses and all orders for this visit:  Overweight (BMI 31.0-31.9) Discussed dietary and exercise modifications -     phentermine (ADIPEX-P) 37.5 MG tablet; Take 1/2 to 1 tablet each morning for Dieting & Weight Loss  Encounter for weight management Follow up in 4 weeks. Discussed dietary modifications Encouraged to eat breakfast, continue to improve food choices. Provided food log to track food and bring to follow up appointment  Plan: She will work on meal planning, intentional eating, and increasing water.  She has been instructed to work up to a goal of 150 minutes of combined cardio and strengthening exercise  per week for weight loss and overall health benefits. We discussed the following Behavioral Modification Strategies today: increasing lean protein intake, decreasing simple carbohydrates, increasing vegetables, increase H20 intake, decrease eating out, no skipping meals, work on meal planning and easy cooking plans, keeping healthy foods in the home, and planning for success.   She has agreed to follow-up with our clinic in 4 weeks. She was informed of the importance of frequent follow-up visits to maximize success with intensive lifestyle modifications for her multiple health conditions.  Medication refill: phentermine 37.5mg  tablet, daily in am, prior to first meal.   Future Appointments   Date Time Provider Department Center  04/10/2020  2:00 PM [facility] NURSE [facility] None  05/20/2020  2:30 PM Elder Negus, NP [facility] None  01/01/2021  9:00 AM Elder Negus, NP [facility] None

## 2020-03-25 ENCOUNTER — Ambulatory Visit (INDEPENDENT_AMBULATORY_CARE_PROVIDER_SITE_OTHER): Payer: No Typology Code available for payment source | Admitting: Adult Health Nurse Practitioner

## 2020-03-25 ENCOUNTER — Other Ambulatory Visit: Payer: Self-pay

## 2020-03-25 ENCOUNTER — Encounter: Payer: Self-pay | Admitting: Adult Health Nurse Practitioner

## 2020-03-25 VITALS — Wt 185.6 lb

## 2020-03-25 DIAGNOSIS — Z7689 Persons encountering health services in other specified circumstances: Secondary | ICD-10-CM

## 2020-03-25 DIAGNOSIS — Z6831 Body mass index (BMI) 31.0-31.9, adult: Secondary | ICD-10-CM

## 2020-03-25 DIAGNOSIS — E663 Overweight: Secondary | ICD-10-CM

## 2020-03-25 MED ORDER — PHENTERMINE HCL 37.5 MG PO TABS: ORAL_TABLET | ORAL | 0 refills | Status: DC

## 2020-03-30 NOTE — Addendum Note (Signed)
Addended by: Elder Negus A on: 03/30/2020 08:31 PM   Modules accepted: Level of Service

## 2020-03-31 ENCOUNTER — Other Ambulatory Visit: Payer: Self-pay | Admitting: Adult Health Nurse Practitioner

## 2020-04-09 ENCOUNTER — Ambulatory Visit: Payer: No Typology Code available for payment source

## 2020-04-10 ENCOUNTER — Ambulatory Visit: Payer: No Typology Code available for payment source

## 2020-05-20 ENCOUNTER — Ambulatory Visit: Payer: No Typology Code available for payment source | Admitting: Adult Health Nurse Practitioner

## 2020-05-27 ENCOUNTER — Encounter: Payer: Self-pay | Admitting: Adult Health Nurse Practitioner

## 2020-05-27 ENCOUNTER — Ambulatory Visit (INDEPENDENT_AMBULATORY_CARE_PROVIDER_SITE_OTHER): Payer: No Typology Code available for payment source | Admitting: Adult Health Nurse Practitioner

## 2020-05-27 ENCOUNTER — Other Ambulatory Visit: Payer: Self-pay

## 2020-05-27 VITALS — BP 122/80 | HR 112 | Temp 97.9°F | Ht 64.0 in | Wt 183.0 lb

## 2020-05-27 DIAGNOSIS — Z7689 Persons encountering health services in other specified circumstances: Secondary | ICD-10-CM | POA: Diagnosis not present

## 2020-05-27 DIAGNOSIS — F900 Attention-deficit hyperactivity disorder, predominantly inattentive type: Secondary | ICD-10-CM

## 2020-05-27 MED ORDER — AMPHETAMINE-DEXTROAMPHET ER 5 MG PO CP24: 5.0000 mg | ORAL_CAPSULE | Freq: Every day | ORAL | 0 refills | Status: DC

## 2020-05-27 NOTE — Progress Notes (Signed)
Weight Management Follow Up  24 y.o.female presents for a follow up after being on phentermine 37.5mg  for weight loss.   While on the medication they have lost 8 lbs total.  She reports that she has not been taking this consistently.  She had had some palpitations at times.  She reports that she monitors her pulse on her watch.  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   Deny palpitations, anxiety, trouble sleeping, elevated BP.  She is concerned about her concentration.,   She reports she has taken adderall in the past but didn't like the "crash" after taking it.  She noted the days she took the phentermine she had greater focus.  BP Readings from Last 3 Encounters:  05/27/20 122/80  01/15/20 120/82  01/01/20 112/80    BMI is Body mass index is 31.41 kg/m., she is working on diet and exercise. Wt Readings from Last 3 Encounters:  05/27/20 183 lb (83 kg)  03/25/20 185 lb 9.6 oz (84.2 kg)  01/15/20 192 lb (87.1 kg)   COVID-19 positive on 02/20/20, fever chills muscle aches, cough, loss, taste & smell at that time all since resolved.   Typical breakfast:  Skips breakfast most days, has tried to improve this. Typical lunch: Varies salad, left overs.  Has reduced fast food intake as well as [employer]eps[employer].  Typical dinner: Chicken vegetables typically.   Water intake: admits not meeting goal.    Medications:  Current Outpatient Medications (Endocrine & Metabolic):  Marland Kitchen  Norethin-Eth Estrad-Fe Biphas (LO LOESTRIN FE PO), Take by mouth daily.      Current Outpatient Medications (Other):  Marland Kitchen  Cholecalciferol (D3-50) 1.25 MG (50000 UT) capsule, TAKE 1 CAPSULE BY MOUTH 3 DAYS A WEEK FOR 12 WEEKS .  COLLAGEN PO, Take by mouth daily.  ROS: All negative except for above  Physical exam: Vitals:   05/27/20 1057  BP: 122/80  Pulse: (!) 112  Temp: 97.9 F (36.6 C)  SpO2: 97%   Physical Exam Vitals and  nursing note reviewed.  Constitutional:      Appearance: Normal appearance.  HENT:     Head: Normocephalic.  Eyes:     Extraocular Movements: Extraocular movements intact.     Conjunctiva/sclera: Conjunctivae normal.     Pupils: Pupils are equal, round, and reactive to light.  Cardiovascular:     Rate and Rhythm: Tachycardia present.     Heart sounds: Normal heart sounds.  Pulmonary:     Effort: Pulmonary effort is normal.  Musculoskeletal:        General: Normal range of motion.     Cervical back: Normal range of motion and neck supple.  Skin:    General: Skin is warm.  Neurological:     General: No focal deficit present.     Mental Status: She is alert and oriented to person, place, and time. Mental status is at baseline.  Psychiatric:        Mood and Affect: Mood normal.        Behavior: Behavior normal.        Thought Content: Thought content normal.        Judgment: Judgment normal.     Assessment: Karimah was seen today for follow-up.  Diagnoses and all orders for this visit:  Overweight (BMI 31.0-31.9) Discussed dietary and exercise modifications -     phentermine (ADIPEX-P) 37.5  MG tablet; Take 1/2 to 1 tablet each morning for Dieting & Weight Loss  Encounter for weight management Follow up in 4 weeks. Discussed dietary modifications Encouraged to eat breakfast, continue to improve food choices. Provided food log to track food and bring to follow up appointment  Plan: She will work on meal planning, intentional eating, and increasing water.  She has been instructed to work up to a goal of 150 minutes of combined cardio and strengthening exercise per week for weight loss and overall health benefits. We discussed the following Behavioral Modification Strategies today: increasing lean protein intake, decreasing simple carbohydrates, increasing vegetables, increase H20 intake, decrease eating out, no skipping meals, work on meal planning and easy cooking plans,  keeping healthy foods in the home, and planning for success.   D/C Phentermine at this time Discussed alternatives.  She is working to continue improving diet and her exercise without medication.  ADD: Rx Adderall XR 5mg  once daily Discussed medication and use. Monitor pulse Contact office with any new or worsening symptoms  Follow up in three months or sooner if needed.  Future Appointments  Date Time Provider Department Center  01/01/2021  9:00 AM 01/03/2021, NP [facility] None

## 2020-06-23 ENCOUNTER — Encounter: Payer: Self-pay | Admitting: Internal Medicine

## 2020-06-23 ENCOUNTER — Ambulatory Visit (INDEPENDENT_AMBULATORY_CARE_PROVIDER_SITE_OTHER): Payer: No Typology Code available for payment source | Admitting: Internal Medicine

## 2020-06-23 ENCOUNTER — Other Ambulatory Visit: Payer: Self-pay

## 2020-06-23 VITALS — BP 116/70 | HR 103 | Temp 97.3°F | Resp 16 | Ht 64.0 in | Wt 185.4 lb

## 2020-06-23 DIAGNOSIS — L600 Ingrowing nail: Secondary | ICD-10-CM

## 2020-06-23 DIAGNOSIS — L03031 Cellulitis of right toe: Secondary | ICD-10-CM | POA: Diagnosis not present

## 2020-06-23 MED ORDER — CEPHALEXIN 500 MG PO CAPS: ORAL_CAPSULE | ORAL | 0 refills | Status: DC

## 2020-06-23 NOTE — Progress Notes (Signed)
   History of Present Illness:     This very nice 25 yo MWF with neg PMHx present with a painful infected Rt 1st ingrown toenail.  Medications  .  amphetamine-dextroamphetamine (ADDERALL XR) 5 MG 24 hr capsule, Take 1 capsule (5 mg total) by mouth daily.  Problem list She has Hx of thyroiditis; ADD (attention deficit disorder); Overweight (BMI 25.0-29.9); Vitamin D deficiency; Elevated LFTs; Encounter for induction of labor; SVD (spontaneous vaginal delivery); Postpartum care following vaginal delivery 9/18; Perineal laceration, second degree; Maternal anemia, with delivery; and COVID-19 (02/19/2020) on their problem list.   Observations/Objective:  BP 116/70   Pulse (!) 103   Temp (!) 97.3 F (36.3 C)   Resp 16   Ht 5\' 4"  (1.626 m)   Wt 185 lb 6.4 oz (84.1 kg)   SpO2 97%   BMI 31.82 kg/m   Exam focused on Rt foot finds medial toenail of Rt 1st toe is ingrown & appears infected.   Procedure (CPT [id number])       After informed consent and aseptic prep with isopropyl alcohol and then local anesthesia with 1 ml of Marcaine 0.5% the medial edge of the Right 1st toe nail was freed up, everted and sharply trimmed.  Then the wound base was debrided of granulation tissue and  cauterized. Neosporin sterile dsg was applied. Patient was instructed in post-op wound care.     Assessment and Plan:  1. Paronychia of toe of right foot due to ingrown toenail  - cephALEXin (KEFLEX) 500 MG capsule; Take     1 capsule     4 x /day       with Meals & Bedtime       for skin Infection  Dispense: 40 capsule; Refill: 0  Follow Up Instructions:       I discussed the assessment and treatment plan with the patient. The patient was provided an opportunity to ask questions and all were answered. The patient agreed with the plan and demonstrated an understanding of the instructions.       The patient was advised to call back or seek an in-person evaluation if the symptoms worsen or if the condition fails to  improve as anticipated.    [id number], MD

## 2020-08-26 ENCOUNTER — Ambulatory Visit: Payer: No Typology Code available for payment source | Admitting: Adult Health Nurse Practitioner

## 2020-09-10 ENCOUNTER — Other Ambulatory Visit: Payer: Self-pay

## 2020-09-10 ENCOUNTER — Ambulatory Visit (INDEPENDENT_AMBULATORY_CARE_PROVIDER_SITE_OTHER): Payer: No Typology Code available for payment source | Admitting: Internal Medicine

## 2020-09-10 ENCOUNTER — Encounter: Payer: Self-pay | Admitting: Internal Medicine

## 2020-09-10 VITALS — BP 110/68 | HR 70 | Temp 98.0°F | Resp 12 | Ht 64.0 in | Wt 185.0 lb

## 2020-09-10 DIAGNOSIS — L600 Ingrowing nail: Secondary | ICD-10-CM | POA: Diagnosis not present

## 2020-09-10 NOTE — Progress Notes (Signed)
   Present Illness:                                      This delightful 25 yo MWF with neg PMHx returns with a painful Rt 1st ingrown toenail.  Medications  .  amphetamine-dextroamphetamine (ADDERALL XR) 5 MG 24 hr capsule, Take 1 capsule (5 mg total) by mouth daily.  Problem list She has Hx of thyroiditis; ADD (attention deficit disorder); Overweight (BMI 25.0-29.9); Vitamin D deficiency; Elevated LFTs; Encounter for induction of labor; SVD (spontaneous vaginal delivery); Postpartum care following vaginal delivery 9/18; Perineal laceration, second degree; Maternal anemia, with delivery; and COVID-19 (02/19/2020) on their problem list.  Observations/Objective:  BP 110/68  P 70  T 98.0 R 12   Ht 5\' 4"     Wt 185 lb    SpO2 98%   BMI 31.80  Exam focused on Rt foot finds medial 7 lateral  toenail of Rt 1st toe is ingrown red & tender. No purulence /lymphangitic streaking.   Procedure (CPT  code [id number])                                                   After informed consent and aseptic prep with isopropyl alcohol the medial & lateral edge of the Right 1st toe nail were sharply trimmed & debrided of non viable calloused over lying  skin. Neosporin sterile dsg was applied.  Patient was instructed in post-op wound care.    Assessment and Plan:  1. Ingrown right big toenail   Follow Up Instructions:                                                  I discussed the assessment and treatment plan with the patient. The patient was provided an opportunity to ask questions and all were answered. The patient agreed with the plan and demonstrated an understanding of the instructions.                                                      The patient was advised to call back or seek an in-person evaluation if the symptoms worsen or if the condition fails to improve as anticipated.    [id number], MD

## 2020-09-15 ENCOUNTER — Encounter: Payer: Self-pay | Admitting: Adult Health Nurse Practitioner

## 2020-09-15 ENCOUNTER — Ambulatory Visit (INDEPENDENT_AMBULATORY_CARE_PROVIDER_SITE_OTHER): Payer: No Typology Code available for payment source | Admitting: Adult Health Nurse Practitioner

## 2020-09-15 ENCOUNTER — Other Ambulatory Visit: Payer: Self-pay

## 2020-09-15 VITALS — BP 110/82 | HR 101 | Temp 97.0°F | Ht 64.0 in | Wt 191.4 lb

## 2020-09-15 DIAGNOSIS — J039 Acute tonsillitis, unspecified: Secondary | ICD-10-CM | POA: Diagnosis not present

## 2020-09-15 DIAGNOSIS — Z79899 Other long term (current) drug therapy: Secondary | ICD-10-CM

## 2020-09-15 DIAGNOSIS — J019 Acute sinusitis, unspecified: Secondary | ICD-10-CM | POA: Diagnosis not present

## 2020-09-15 MED ORDER — DEXAMETHASONE SODIUM PHOSPHATE 10 MG/ML IJ SOLN
10.0000 mg | Freq: Once | INTRAMUSCULAR | Status: AC
  Administered 2020-09-15: 10 mg via INTRAMUSCULAR

## 2020-09-15 MED ORDER — AMOXICILLIN-POT CLAVULANATE 875-125 MG PO TABS: 1.0000 | ORAL_TABLET | Freq: Two times a day (BID) | ORAL | 0 refills | Status: DC

## 2020-09-15 NOTE — Progress Notes (Signed)
Assessment and Plan:  Adaliz was seen today for cough.  Diagnoses and all orders for this visit:  Tonsillitis -     dexamethasone (DECADRON) injection 10 mg received today Discussed salt water gargles, lozenges   Acute sinusitis, recurrence not specified, unspecified location -     amoxicillin-clavulanate (AUGMENTIN) 875-125 MG tablet; Take 1 tablet by mouth 2 (two) times daily. 10 days Discussed sudafed Q4-6 prn, monitor blood pressure , stop if elevated. Take OTC mucinex daily  Take ibuprofen 600mg  every 6 hours or 800mg  every 8 for inflammation No labs or swab today, providing treatment that would cover strep  Medication management Continued   Discussed hospital precautions or difficulty swallowing, breathing.  Contact office with any new or worsening symptoms.    Further disposition pending results of labs. Discussed med's effects and SE's.   Over 30 minutes of face to face interview, exam, counseling, chart review, and critical decision making was performed.   Future Appointments  Date Time Provider Department Center  10/09/2020  2:30 PM , NP [facility] None  01/01/2021  9:00 AM Judd Gaudier, NP [facility] None    ------------------------------------------------------------------------------------------------------------------   HPI 25 y.o.female presents for evaluation of symptoms six day s ago.  Started with a sore throat, worsening after one day that woke her in her sleep.  She ended up working as her COVID test was negative.  She had laryngitis and was only able to complete half a shift one day ago. She also has headache for the past two days, cough that is productive, green phlem.  The cough does not wake her up.  With intermittent nasal congestion with drainage.  She reports her throat feels full and feels like it is difficult to breath.  Denies any watery eyes, oltagia Sh has tried mucinex cold and flu every 4hours and cough drops.  Has tried  zyrtec one night and it made her too drowsy.  Past Medical History:  Diagnosis Date  . ADD (attention deficit disorder) 03/03/2014  . Hx of thyroiditis      No Known Allergies  Current Outpatient Medications on File Prior to Visit  Medication Sig  . amphetamine-dextroamphetamine (ADDERALL XR) 5 MG 24 hr capsule Take 1 capsule (5 mg total) by mouth daily.   No current facility-administered medications on file prior to visit.    ROS: all negative except above.   Physical Exam:   BP 110/82   Pulse (!) 101   Temp (!) 97 F (36.1 C)   Ht 5\' 4"  (1.626 m)   Wt 191 lb 6.4 oz (86.8 kg)   SpO2 98%   BMI 32.85 kg/m   General Appearance: Well nourished, in no apparent distress. Eyes: PERRLA, EOMs, conjunctiva no swelling or erythema Sinuses: Frontal/maxillary tenderness ENT/Mouth: Ext aud canals clear, TMs without erythema, bulging. Serous noted bilaterally.  Erythema, swelling, & exudate on post pharynx.  Tonsils erythematous +3.  Hearing normal.  Neck: Supple, thyroid normal.  Respiratory: Respiratory effort normal, BS equal bilaterally without rales, rhonchi, wheezing or stridor.  Cardio: RRR with no MRGs. Brisk peripheral pulses without edema.  Abdomen: Soft, + BS.  Non tender, no guarding, rebound, hernias, masses. Lymphatics: Cervical tenderness.  Non tender all others lymphadenopathy.  Musculoskeletal: Full ROM, 5/5 strength, normal gait.  Skin: Warm, dry without rashes, lesions, ecchymosis.  Neuro: Cranial nerves intact. Normal muscle tone, no cerebellar symptoms. Sensation intact.  Psych: Awake and oriented X 3, normal affect, Insight and Judgment appropriate.     30,  NP 3:32 PM [facility]

## 2020-09-17 ENCOUNTER — Other Ambulatory Visit: Payer: Self-pay | Admitting: Adult Health Nurse Practitioner

## 2020-09-17 DIAGNOSIS — J0110 Acute frontal sinusitis, unspecified: Secondary | ICD-10-CM

## 2020-09-17 MED ORDER — DEXAMETHASONE 1 MG PO TABS: ORAL_TABLET | ORAL | 1 refills | Status: DC

## 2020-09-23 ENCOUNTER — Encounter: Payer: Self-pay | Admitting: Internal Medicine

## 2020-09-23 ENCOUNTER — Other Ambulatory Visit: Payer: Self-pay

## 2020-09-23 ENCOUNTER — Ambulatory Visit (INDEPENDENT_AMBULATORY_CARE_PROVIDER_SITE_OTHER): Payer: No Typology Code available for payment source | Admitting: Internal Medicine

## 2020-09-23 ENCOUNTER — Ambulatory Visit: Payer: No Typology Code available for payment source | Admitting: Internal Medicine

## 2020-09-23 VITALS — BP 128/72 | HR 97 | Temp 97.7°F | Resp 16 | Ht 64.0 in | Wt 189.0 lb

## 2020-09-23 DIAGNOSIS — L03031 Cellulitis of right toe: Secondary | ICD-10-CM

## 2020-09-23 DIAGNOSIS — L600 Ingrowing nail: Secondary | ICD-10-CM | POA: Diagnosis not present

## 2020-09-23 MED ORDER — DOXYCYCLINE HYCLATE 100 MG PO CAPS: ORAL_CAPSULE | ORAL | 0 refills | Status: DC

## 2020-09-23 NOTE — Progress Notes (Deleted)
   Future Appointments  Date Time Provider Department Center  09/23/2020  4:30 PM Lucky Cowboy, MD [facility] None  10/09/2020  2:30 PM Judd Gaudier, NP [facility] None  01/01/2021  9:00 AM Elder Negus, NP [facility] None     History of Present Illness:      Medications  .  dexamethasone (DECADRON) 1 MG tablet, Take 1 tab 3 x day - 3 days, then 2 x day - 3 days, then 1 tab daily  .  amoxicillin-clavulanate (AUGMENTIN) 875-125 MG tablet, Take 1 tablet by mouth 2 (two) times daily. 10 days .  amphetamine-dextroamphetamine (ADDERALL XR) 5 MG 24 hr capsule, Take 1 capsule (5 mg total) by mouth daily.  Problem list She has Hx of thyroiditis; ADD (attention deficit disorder); Overweight (BMI 25.0-29.9); Vitamin D deficiency; Elevated LFTs; Encounter for induction of labor; SVD (spontaneous vaginal delivery); Postpartum care following vaginal delivery 9/18; Perineal laceration, second degree; Maternal anemia, with delivery; and COVID-19 (02/19/2020) on their problem list.   Observations/Objective:   There were no vitals taken for this visit.  HEENT - WNL. Neck - supple.  Chest - Clear equal BS. Cor - Nl HS. RRR w/o sig MGR. PP 1(+). No edema. MS- FROM w/o deformities.  Gait Nl. Neuro -  Nl w/o focal abnormalities.    Assessment and Plan:      Follow Up Instructions:    I discussed the assessment and treatment plan with the patient. The patient was provided an opportunity to ask questions and all were answered. The patient agreed with the plan and demonstrated an understanding of the instructions.   The patient was advised to call back or seek an in-person evaluation if the symptoms worsen or if the condition fails to improve as anticipated.    Marinus Maw, MD

## 2020-09-23 NOTE — Progress Notes (Signed)
   Future Appointments  Date Time Provider Department Center  10/09/2020  2:30 PM Judd Gaudier, NP [facility] None  01/01/2021  9:00 AM Elder Negus, NP [facility] None   History of Present Illness:     Patient is a very nice 25 yo MWF returning with c/o of recurrent ingrown toe nail of the Rt 1st toe. Patient has had prior debridement.    Medications  None current   Problem list She has Hx of thyroiditis; ADD (attention deficit disorder); Overweight (BMI 25.0-29.9); Vitamin D deficiency; Elevated LFTs; Encounter for induction of labor; SVD (spontaneous vaginal delivery); Postpartum care following vaginal delivery 9/18; Perineal laceration, second degree; Maternal anemia, with delivery; and COVID-19 (02/19/2020) on their problem list.   Observations/Objective:  BP 128/72   Pulse 97   Temp 97.7 F (36.5 C) (Temporal)   Resp 16   Ht 5\' 4"  (1.626 m)   Wt 189 lb (85.7 kg)   SpO2 97%   BMI 32.44 kg/m    Exam focused on the Right Great toe finds both medial & lateral margins deeply ingrown, very tender & inflamed.   Procedure  (CPT: )      After informed consent & aseptic prep with alcohol and local anesthesia with 3 cc of Marcaine 0.5%, the medial & later nail margins were trimmed to the growth plate  ith debridement of inflamed & necrotic tissue along the medial & lateral cuticle margins.  Margins were lightly hyfrecated for hemostasis. Then Antibiotic ointment and pressure dressing bandage was applied & patient was instucted in post-op care.   Assessment and Plan:  1. Ingrown right big toenail   2. Paronychia of toe of right foot due to ingrown toenail  - doxycycline (VIBRAMYCIN) 100 MG capsule; Take 1 capsule 2 x /day with meals for Infection  Dispense: 60 capsule; Refill: 0   Follow Up Instructions:      I discussed the assessment and treatment plan with the patient. The patient was provided an opportunity to ask questions and all were answered. The patient  agreed with the plan and demonstrated an understanding of the instructions.       The patient was advised to call back or seek an in-person evaluation if the symptoms worsen or if the condition fails to improve as anticipated.   [id number], MD

## 2020-09-26 ENCOUNTER — Other Ambulatory Visit: Payer: Self-pay | Admitting: Adult Health

## 2020-09-26 MED ORDER — FLUCONAZOLE 150 MG PO TABS: 150.0000 mg | ORAL_TABLET | Freq: Once | ORAL | 3 refills | Status: AC

## 2020-10-09 ENCOUNTER — Ambulatory Visit: Payer: No Typology Code available for payment source | Admitting: Adult Health

## 2020-12-31 LAB — OB RESULTS CONSOLE ABO/RH: RH Type: NEGATIVE

## 2020-12-31 LAB — OB RESULTS CONSOLE ANTIBODY SCREEN: Antibody Screen: NEGATIVE

## 2020-12-31 NOTE — Progress Notes (Signed)
Complete Physical  Assessment and Plan: Health Maintenance- Discussed STD testing, safe sex, alcohol and drug awareness, drinking and driving dangers, wearing a seat belt and general safety measures for young adult.  Kelsey Wheeler was seen today for annual exam.  Diagnoses and all orders for this visit: Kelsey Wheeler was seen today for annual exam.  Diagnoses and all orders for this visit:  Encounter for general health examination  Attention deficit disorder (ADD) without hyperactivity  Vitamin D deficiency -     VITAMIN D 25 Hydroxy (Vit-D Deficiency, Fractures)  BMI 33.0-33.9,adult -     Lipid panel  Screening for thyroid disorder -     TSH  Screening for diabetes mellitus (DM) -     Hemoglobin A1c  Screening for blood or protein in urine -     Urinalysis, Routine w reflex microscopic -     Microalbumin / creatinine urine ratio  Medication management -     CBC with Differential/Platelet -     COMPLETE METABOLIC PANEL WITH GFR -     Magnesium -     TSH -     Lipid panel -     Hemoglobin A1c -     VITAMIN D 25 Hydroxy (Vit-D Deficiency, Fractures) -     Urinalysis, Routine w reflex microscopic -     Microalbumin / creatinine urine ratio  History of anemia -     CBC with Differential/Platelet  Less than [redacted] weeks gestation of pregnancy       - Continue prenatal vitamin, Follow with [facility]   Discussed med's effects and SE's. Screening labs and tests as requested with regular follow-up as recommended. Over 40 minutes of face to face interview, exam, counseling, chart review and critical decision making was performed  HPI  This very nice 25 y.o.female presents for complete physical.   She follows with [facility], Dr Kelsey Wheeler.  She is O- and had one injection of rhogam duringegnancy and second post delivery  Pt is pregnant with LMP of 11/15/2020.  Will continue to follow with [facility]  She has history of anemia and vitamin D deficiency and ADD and not taking any  medication for this.  Reports she mainly utilized this while she was in school. Pt is currently takng a prenatal vitamin.  She does not workout through she is on her feet for 12hours during her work day. BMI is Body mass index is 33.61 kg/m., she has been working on diet and exercise. Wt Readings from Last 3 Encounters:  01/01/21 195 lb 12.8 oz (88.8 kg)  09/23/20 189 lb (85.7 kg)  09/15/20 191 lb 6.4 oz (86.8 kg)     Finally, patient has history of Vitamin D Deficiency and last vitamin D was  Lab Results  Component Value Date   VD25OH 16 (L) 01/01/2020     Current Medications:  Current Outpatient Medications on File Prior to Visit  Medication Sig Dispense Refill   Prenatal Vit-Fe Fumarate-FA (PRENATAL MULTIVITAMIN) TABS tablet Take 1 tablet by mouth daily at 12 noon.     amphetamine-dextroamphetamine (ADDERALL XR) 5 MG 24 hr capsule Take 1 capsule (5 mg total) by mouth daily. (Patient not taking: No sig reported) 30 capsule 0   doxycycline (VIBRAMYCIN) 100 MG capsule Take 1 capsule 2 x /day with meals for Infection (Patient not taking: Reported on 01/01/2021) 60 capsule 0   No current facility-administered medications on file prior to visit.   Health Maintenance:   Immunization History  Administered Date(s)  Administered   HPV Quadrivalent 03/11/2008, 05/17/2008, 09/18/2008   Hepatitis B 12/06/2011   Influenza Inj Mdck Quad With Preservative 03/15/2017, 02/23/2018, 04/03/2019   Influenza Split 03/29/2013, 03/12/2014   Influenza,inj,quad, With Preservative 03/16/2016   PFIZER(Purple Top)SARS-COV-2 Vaccination 04/01/2020, 05/06/2020   PPD Test 11/15/2014, 11/05/2015, 12/17/2016   Tdap 09/07/2011    TD/TDAP: 2013 Influenza: 2021 Pneumovax: N/A Prevnar 13: N/A HPV vaccines: Complete 2010  LMP: 11/15/2020 Sexually Active: No, STD testing offered, deferred. Pap: 07/2018 normal with negative GC/C MGM: N/A  Pregnancy Screenings: HIV screening - Non-reactive RPR:  Non-reactive Hep B : Negative Rubella: Immune   Allergies: No Known Allergies Medical History:  has Hx of thyroiditis; ADD (attention deficit disorder); Overweight (BMI 25.0-29.9); Vitamin D deficiency; Elevated LFTs; Encounter for induction of labor; SVD (spontaneous vaginal delivery); Postpartum care following vaginal delivery 9/18; Perineal laceration, second degree; Maternal anemia, with delivery; and COVID-19 (02/19/2020) on their problem list. Surgical History:  She  has a past surgical history that includes Wisdom tooth extraction. Family History:  Her family history includes Atrial fibrillation in her paternal grandmother; Breast cancer in her maternal grandmother; Diabetes in her paternal grandfather and paternal grandmother; Heart disease in her paternal grandfather; Heart failure in her paternal grandfather; Hyperlipidemia in her father, maternal grandmother, mother, and paternal grandfather; Hypertension in her maternal grandmother; Lung cancer in her maternal grandfather and maternal grandmother. Social History:   reports that she has never smoked. She has never used smokeless tobacco. She reports previous alcohol use. She reports that she does not use drugs.  Review of Systems: Review of Systems  Constitutional:  Negative for chills, diaphoresis, fever, malaise/fatigue and weight loss.  HENT:  Negative for congestion, ear discharge, ear pain, hearing loss, nosebleeds, sinus pain, sore throat and tinnitus.   Eyes:  Negative for blurred vision, double vision, photophobia, pain, discharge and redness.  Respiratory:  Negative for cough, hemoptysis, sputum production, shortness of breath, wheezing and stridor.   Cardiovascular:  Negative for chest pain, palpitations, orthopnea, claudication, leg swelling and PND.  Gastrointestinal:  Negative for abdominal pain, blood in stool, constipation, diarrhea, heartburn, melena, nausea and vomiting.  Genitourinary:  Negative for dysuria, flank  pain, frequency, hematuria and urgency.  Musculoskeletal:  Negative for back pain, falls, joint pain, myalgias and neck pain.  Skin:  Negative for itching and rash.  Neurological:  Negative for dizziness, tingling, tremors, sensory change, speech change, focal weakness, seizures, loss of consciousness, weakness and headaches.  Endo/Heme/Allergies:  Negative for environmental allergies and polydipsia. Does not bruise/bleed easily.  Psychiatric/Behavioral:  Negative for depression, hallucinations, memory loss, substance abuse and suicidal ideas. The patient is not nervous/anxious and does not have insomnia.    Physical Exam: Estimated body mass index is 33.61 kg/m as calculated from the following:   Height as of this encounter: 5\' 4"  (1.626 m).   Weight as of this encounter: 195 lb 12.8 oz (88.8 kg). BP 120/90   Pulse (!) 112   Temp (!) 97.5 F (36.4 C)   Ht 5\' 4"  (1.626 m)   Wt 195 lb 12.8 oz (88.8 kg)   SpO2 97%   BMI 33.61 kg/m  General Appearance: Well nourished, in no apparent distress.  Eyes: PERRLA, EOMs, conjunctiva no swelling or erythema, normal fundi and vessels.  Sinuses: No Frontal/maxillary tenderness  ENT/Mouth: Ext aud canals clear, normal light reflex with TMs without erythema, bulging. Good dentition. No erythema, swelling, or exudate on post pharynx. Tonsils not swollen or erythematous. Hearing normal.  Neck:  Supple, thyroid normal. No bruits  Respiratory: Respiratory effort normal, BS equal bilaterally without rales, rhonchi, wheezing or stridor.  Cardio: RRR without murmurs, rubs or gallops. Brisk peripheral pulses without edema.  Chest: symmetric, with normal excursions and percussion.  Abdomen: nontender, no guarding, rebound, hernias, masses, or UTA organomegaly related to pregnancy. Lymphatics: Non tender without lymphadenopathy.  Genitourinary: defer Musculoskeletal: Full ROM all peripheral extremities,5/5 strength, and normal gait.  Skin: Warm, dry without  rashes, lesions, ecchymosis. Neuro: Cranial nerves intact, reflexes equal bilaterally. Normal muscle tone, no cerebellar symptoms. Sensation intact.  Psych: Awake and oriented X 3, normal affect, Insight and Judgment appropriate.   EKG: defer  Everado Pillsbury Gigi Gin, NP 9:30 AM [facility]

## 2021-01-01 ENCOUNTER — Encounter: Payer: Self-pay | Admitting: Nurse Practitioner

## 2021-01-01 ENCOUNTER — Encounter: Payer: No Typology Code available for payment source | Admitting: Adult Health Nurse Practitioner

## 2021-01-01 ENCOUNTER — Other Ambulatory Visit: Payer: Self-pay

## 2021-01-01 ENCOUNTER — Ambulatory Visit (INDEPENDENT_AMBULATORY_CARE_PROVIDER_SITE_OTHER): Payer: No Typology Code available for payment source | Admitting: Nurse Practitioner

## 2021-01-01 VITALS — BP 120/90 | HR 112 | Temp 97.5°F | Ht 64.0 in | Wt 195.8 lb

## 2021-01-01 DIAGNOSIS — Z1329 Encounter for screening for other suspected endocrine disorder: Secondary | ICD-10-CM

## 2021-01-01 DIAGNOSIS — Z79899 Other long term (current) drug therapy: Secondary | ICD-10-CM

## 2021-01-01 DIAGNOSIS — Z6833 Body mass index (BMI) 33.0-33.9, adult: Secondary | ICD-10-CM

## 2021-01-01 DIAGNOSIS — Z1389 Encounter for screening for other disorder: Secondary | ICD-10-CM | POA: Diagnosis not present

## 2021-01-01 DIAGNOSIS — Z131 Encounter for screening for diabetes mellitus: Secondary | ICD-10-CM | POA: Diagnosis not present

## 2021-01-01 DIAGNOSIS — Z3A01 Less than 8 weeks gestation of pregnancy: Secondary | ICD-10-CM

## 2021-01-01 DIAGNOSIS — Z0000 Encounter for general adult medical examination without abnormal findings: Secondary | ICD-10-CM | POA: Diagnosis not present

## 2021-01-01 DIAGNOSIS — Z13220 Encounter for screening for lipoid disorders: Secondary | ICD-10-CM

## 2021-01-01 DIAGNOSIS — Z862 Personal history of diseases of the blood and blood-forming organs and certain disorders involving the immune mechanism: Secondary | ICD-10-CM

## 2021-01-01 DIAGNOSIS — F988 Other specified behavioral and emotional disorders with onset usually occurring in childhood and adolescence: Secondary | ICD-10-CM

## 2021-01-01 DIAGNOSIS — E559 Vitamin D deficiency, unspecified: Secondary | ICD-10-CM

## 2021-01-01 NOTE — Patient Instructions (Signed)

## 2021-01-02 LAB — CBC WITH DIFFERENTIAL/PLATELET
Absolute Monocytes: 492 cells/uL (ref 200–950)
Basophils Absolute: 60 cells/uL (ref 0–200)
Basophils Relative: 0.5 %
Eosinophils Absolute: 60 cells/uL (ref 15–500)
Eosinophils Relative: 0.5 %
HCT: 44.9 % (ref 35.0–45.0)
Hemoglobin: 15.7 g/dL — ABNORMAL HIGH (ref 11.7–15.5)
Lymphs Abs: 2496 cells/uL (ref 850–3900)
MCH: 31 pg (ref 27.0–33.0)
MCHC: 35 g/dL (ref 32.0–36.0)
MCV: 88.6 fL (ref 80.0–100.0)
MPV: 11.4 fL (ref 7.5–12.5)
Monocytes Relative: 4.1 %
Neutro Abs: 8892 cells/uL — ABNORMAL HIGH (ref 1500–7800)
Neutrophils Relative %: 74.1 %
Platelets: 275 10*3/uL (ref 140–400)
RBC: 5.07 10*6/uL (ref 3.80–5.10)
RDW: 12.4 % (ref 11.0–15.0)
Total Lymphocyte: 20.8 %
WBC: 12 10*3/uL — ABNORMAL HIGH (ref 3.8–10.8)

## 2021-01-02 LAB — HEMOGLOBIN A1C
Hgb A1c MFr Bld: 5.1 % of total Hgb (ref <5.7)
Mean Plasma Glucose: 100 mg/dL
eAG (mmol/L): 5.5 mmol/L

## 2021-01-02 LAB — URINALYSIS, ROUTINE W REFLEX MICROSCOPIC
Bilirubin Urine: NEGATIVE
Glucose, UA: NEGATIVE
Hgb urine dipstick: NEGATIVE
Hyaline Cast: NONE SEEN /LPF
Ketones, ur: NEGATIVE
Nitrite: NEGATIVE
Protein, ur: NEGATIVE
RBC / HPF: NONE SEEN /HPF (ref 0–2)
Specific Gravity, Urine: 1.028 (ref 1.001–1.035)
pH: 5.5 (ref 5.0–8.0)

## 2021-01-02 LAB — COMPLETE METABOLIC PANEL WITH GFR
AG Ratio: 1.5 (calc) (ref 1.0–2.5)
ALT: 35 U/L — ABNORMAL HIGH (ref 6–29)
AST: 18 U/L (ref 10–30)
Albumin: 4.3 g/dL (ref 3.6–5.1)
Alkaline phosphatase (APISO): 83 U/L (ref 31–125)
BUN: 13 mg/dL (ref 7–25)
CO2: 26 mmol/L (ref 20–32)
Calcium: 9.2 mg/dL (ref 8.6–10.2)
Chloride: 106 mmol/L (ref 98–110)
Creat: 0.81 mg/dL (ref 0.50–0.96)
Globulin: 2.9 g/dL (calc) (ref 1.9–3.7)
Glucose, Bld: 104 mg/dL — ABNORMAL HIGH (ref 65–99)
Potassium: 3.9 mmol/L (ref 3.5–5.3)
Sodium: 140 mmol/L (ref 135–146)
Total Bilirubin: 0.4 mg/dL (ref 0.2–1.2)
Total Protein: 7.2 g/dL (ref 6.1–8.1)
eGFR: 102 mL/min/{1.73_m2} (ref >=60)

## 2021-01-02 LAB — VITAMIN D 25 HYDROXY (VIT D DEFICIENCY, FRACTURES): Vit D, 25-Hydroxy: 28 ng/mL — ABNORMAL LOW (ref 30–100)

## 2021-01-02 LAB — LIPID PANEL
Cholesterol: 149 mg/dL (ref <200)
HDL: 45 mg/dL — ABNORMAL LOW (ref >=50)
LDL Cholesterol (Calc): 86 mg/dL (calc)
Non-HDL Cholesterol (Calc): 104 mg/dL (calc) (ref <130)
Total CHOL/HDL Ratio: 3.3 (calc) (ref <5.0)
Triglycerides: 88 mg/dL (ref <150)

## 2021-01-02 LAB — TSH: TSH: 0.99 mIU/L

## 2021-01-02 LAB — MICROSCOPIC MESSAGE

## 2021-01-02 LAB — MICROALBUMIN / CREATININE URINE RATIO
Creatinine, Urine: 196 mg/dL (ref 20–275)
Microalb Creat Ratio: 2 mcg/mg creat (ref <30)
Microalb, Ur: 0.3 mg/dL

## 2021-01-02 LAB — MAGNESIUM: Magnesium: 1.9 mg/dL (ref 1.5–2.5)

## 2021-02-12 LAB — OB RESULTS CONSOLE GC/CHLAMYDIA
Chlamydia: NEGATIVE
Gonorrhea: NEGATIVE

## 2021-02-12 LAB — OB RESULTS CONSOLE RUBELLA ANTIBODY, IGM: Rubella: IMMUNE

## 2021-02-12 LAB — OB RESULTS CONSOLE VARICELLA ZOSTER ANTIBODY, IGG: Varicella: IMMUNE

## 2021-02-12 LAB — OB RESULTS CONSOLE HEPATITIS B SURFACE ANTIGEN: Hepatitis B Surface Ag: NEGATIVE

## 2021-02-12 LAB — OB RESULTS CONSOLE HIV ANTIBODY (ROUTINE TESTING): HIV: NONREACTIVE

## 2021-02-12 LAB — OB RESULTS CONSOLE RPR: RPR: NONREACTIVE

## 2021-06-14 NOTE — L&D Delivery Note (Signed)
Delivery Note ?At 9:23 PM a viable and healthy female was delivered via Vaginal, Spontaneous (Presentation: Right Occiput Anterior).  APGAR: 8, 9; weight pending .   ?Placenta status: Spontaneous, Intact.  Cord:   with the following complications:  .  Cord pH: na ? ?Anesthesia: Epidural ?Episiotomy:  na ?Lacerations:  second ?Suture Repair: 2.0 3.0 vicryl rapide ?Est. Blood Loss (mL):  200 ? ?Mom to postpartum.  Baby to Couplet care / Skin to Skin. ? ?Kelsey Wheeler ?09/02/2021, 9:53 PM ? ? ? ?

## 2021-07-01 ENCOUNTER — Ambulatory Visit: Payer: No Typology Code available for payment source

## 2021-07-15 ENCOUNTER — Encounter: Payer: No Typology Code available for payment source | Attending: Obstetrics and Gynecology | Admitting: Registered"

## 2021-07-15 ENCOUNTER — Other Ambulatory Visit: Payer: Self-pay

## 2021-07-15 DIAGNOSIS — O24419 Gestational diabetes mellitus in pregnancy, unspecified control: Secondary | ICD-10-CM | POA: Insufficient documentation

## 2021-07-17 ENCOUNTER — Encounter: Payer: Self-pay | Admitting: Registered"

## 2021-07-17 DIAGNOSIS — O24419 Gestational diabetes mellitus in pregnancy, unspecified control: Secondary | ICD-10-CM | POA: Insufficient documentation

## 2021-07-17 NOTE — Progress Notes (Signed)
Patient was seen on 07/15/21 for Gestational Diabetes self-management class at the [facility]. The following learning objectives were met by the patient during this course: ° °States the definition of Gestational Diabetes °States why dietary management is important in controlling blood glucose °Describes the effects each nutrient has on blood glucose levels °Demonstrates ability to create a balanced meal plan °Demonstrates carbohydrate counting  °States when to check blood glucose levels °Demonstrates proper blood glucose monitoring techniques °States the effect of stress and exercise on blood glucose levels °States the importance of limiting caffeine and abstaining from alcohol and smoking ° °Blood glucose monitor given: Patient has meter and is checking blood sugar prior to class ° °Patient instructed to monitor glucose levels: °FBS: 60 - <95; 1 hour: <140; 2 hour: <120 ° °Patient received handouts: °Nutrition Diabetes and Pregnancy, including carb counting list ° °Patient will be seen for follow-up as needed. °

## 2021-08-07 LAB — OB RESULTS CONSOLE GBS: GBS: NEGATIVE

## 2021-08-24 ENCOUNTER — Encounter (HOSPITAL_COMMUNITY): Payer: Self-pay | Admitting: *Deleted

## 2021-08-24 ENCOUNTER — Telehealth (HOSPITAL_COMMUNITY): Payer: Self-pay | Admitting: *Deleted

## 2021-08-24 NOTE — Telephone Encounter (Signed)
Preadmission screen  

## 2021-08-26 ENCOUNTER — Encounter (HOSPITAL_COMMUNITY): Payer: Self-pay | Admitting: *Deleted

## 2021-08-26 ENCOUNTER — Telehealth (HOSPITAL_COMMUNITY): Payer: Self-pay | Admitting: *Deleted

## 2021-08-26 NOTE — Telephone Encounter (Signed)
Preadmission screen  

## 2021-08-31 ENCOUNTER — Other Ambulatory Visit: Payer: Self-pay | Admitting: Obstetrics and Gynecology

## 2021-08-31 ENCOUNTER — Telehealth (HOSPITAL_COMMUNITY): Payer: Self-pay | Admitting: *Deleted

## 2021-08-31 NOTE — Telephone Encounter (Signed)
Preadmission screen  

## 2021-09-01 ENCOUNTER — Encounter (HOSPITAL_COMMUNITY): Payer: Self-pay | Admitting: *Deleted

## 2021-09-02 ENCOUNTER — Other Ambulatory Visit: Payer: Self-pay

## 2021-09-02 ENCOUNTER — Inpatient Hospital Stay (HOSPITAL_COMMUNITY)
Admission: AD | Admit: 2021-09-02 | Discharge: 2021-09-04 | DRG: 806 | Disposition: A | Discharge status: Home / Self Care | Payer: No Typology Code available for payment source | Attending: Obstetrics and Gynecology | Admitting: Obstetrics and Gynecology

## 2021-09-02 ENCOUNTER — Inpatient Hospital Stay (HOSPITAL_COMMUNITY): Payer: No Typology Code available for payment source

## 2021-09-02 ENCOUNTER — Inpatient Hospital Stay (HOSPITAL_COMMUNITY): Payer: No Typology Code available for payment source | Admitting: Anesthesiology

## 2021-09-02 ENCOUNTER — Encounter (HOSPITAL_COMMUNITY): Payer: Self-pay | Admitting: Obstetrics and Gynecology

## 2021-09-02 DIAGNOSIS — Z6791 Unspecified blood type, Rh negative: Secondary | ICD-10-CM | POA: Diagnosis not present

## 2021-09-02 DIAGNOSIS — O26893 Other specified pregnancy related conditions, third trimester: Secondary | ICD-10-CM | POA: Diagnosis present

## 2021-09-02 DIAGNOSIS — D62 Acute posthemorrhagic anemia: Secondary | ICD-10-CM | POA: Diagnosis not present

## 2021-09-02 DIAGNOSIS — Z3490 Encounter for supervision of normal pregnancy, unspecified, unspecified trimester: Secondary | ICD-10-CM | POA: Diagnosis present

## 2021-09-02 DIAGNOSIS — O24419 Gestational diabetes mellitus in pregnancy, unspecified control: Secondary | ICD-10-CM | POA: Diagnosis present

## 2021-09-02 DIAGNOSIS — O24420 Gestational diabetes mellitus in childbirth, diet controlled: Principal | ICD-10-CM | POA: Diagnosis present

## 2021-09-02 DIAGNOSIS — Z3A39 39 weeks gestation of pregnancy: Secondary | ICD-10-CM

## 2021-09-02 DIAGNOSIS — O9081 Anemia of the puerperium: Secondary | ICD-10-CM | POA: Diagnosis not present

## 2021-09-02 DIAGNOSIS — O26899 Other specified pregnancy related conditions, unspecified trimester: Secondary | ICD-10-CM

## 2021-09-02 LAB — CBC
HCT: 37.7 % (ref 36.0–46.0)
Hemoglobin: 12.8 g/dL (ref 12.0–15.0)
MCH: 30.1 pg (ref 26.0–34.0)
MCHC: 34 g/dL (ref 30.0–36.0)
MCV: 88.7 fL (ref 80.0–100.0)
Platelets: 290 10*3/uL (ref 150–400)
RBC: 4.25 MIL/uL (ref 3.87–5.11)
RDW: 13.4 % (ref 11.5–15.5)
WBC: 17.7 10*3/uL — ABNORMAL HIGH (ref 4.0–10.5)
nRBC: 0 % (ref 0.0–0.2)

## 2021-09-02 LAB — TYPE AND SCREEN
ABO/RH(D): O NEG
Antibody Screen: POSITIVE

## 2021-09-02 LAB — RPR: RPR Ser Ql: NONREACTIVE

## 2021-09-02 LAB — GLUCOSE, CAPILLARY
Glucose-Capillary: 113 mg/dL — ABNORMAL HIGH (ref 70–99)
Glucose-Capillary: 65 mg/dL — ABNORMAL LOW (ref 70–99)

## 2021-09-02 MED ORDER — METHYLERGONOVINE MALEATE 0.2 MG PO TABS: 0.2000 mg | ORAL_TABLET | ORAL | Status: DC | PRN

## 2021-09-02 MED ORDER — LACTATED RINGERS IV SOLN: INTRAVENOUS | Status: DC

## 2021-09-02 MED ORDER — DIBUCAINE (PERIANAL) 1 % EX OINT: 1.0000 "application " | TOPICAL_OINTMENT | CUTANEOUS | Status: DC | PRN

## 2021-09-02 MED ORDER — OXYTOCIN-SODIUM CHLORIDE 30-0.9 UT/500ML-% IV SOLN
1.0000 m[IU]/min | INTRAVENOUS | Status: DC
  Administered 2021-09-02: 2 m[IU]/min via INTRAVENOUS
  Filled 2021-09-02: qty 500

## 2021-09-02 MED ORDER — WITCH HAZEL-GLYCERIN EX PADS: 1.0000 "application " | MEDICATED_PAD | CUTANEOUS | Status: DC | PRN

## 2021-09-02 MED ORDER — LIDOCAINE HCL (PF) 1 % IJ SOLN: 30.0000 mL | INTRAMUSCULAR | Status: DC | PRN

## 2021-09-02 MED ORDER — ONDANSETRON HCL 4 MG PO TABS: 4.0000 mg | ORAL_TABLET | ORAL | Status: DC | PRN

## 2021-09-02 MED ORDER — CALCIUM CARBONATE ANTACID 500 MG PO CHEW
400.0000 mg | CHEWABLE_TABLET | ORAL | Status: DC | PRN
  Administered 2021-09-02 (×2): 400 mg via ORAL
  Filled 2021-09-02 (×2): qty 2

## 2021-09-02 MED ORDER — SIMETHICONE 80 MG PO CHEW: 80.0000 mg | CHEWABLE_TABLET | ORAL | Status: DC | PRN

## 2021-09-02 MED ORDER — ACETAMINOPHEN 325 MG PO TABS: 650.0000 mg | ORAL_TABLET | ORAL | Status: DC | PRN

## 2021-09-02 MED ORDER — SENNOSIDES-DOCUSATE SODIUM 8.6-50 MG PO TABS
2.0000 | ORAL_TABLET | Freq: Every day | ORAL | Status: DC
  Administered 2021-09-03 – 2021-09-04 (×2): 2 via ORAL
  Filled 2021-09-02 (×2): qty 2

## 2021-09-02 MED ORDER — LACTATED RINGERS IV SOLN
500.0000 mL | Freq: Once | INTRAVENOUS | Status: AC
  Administered 2021-09-02: 500 mL via INTRAVENOUS

## 2021-09-02 MED ORDER — ONDANSETRON HCL 4 MG/2ML IJ SOLN: 4.0000 mg | INTRAMUSCULAR | Status: DC | PRN

## 2021-09-02 MED ORDER — BENZOCAINE-MENTHOL 20-0.5 % EX AERO: 1.0000 "application " | INHALATION_SPRAY | CUTANEOUS | Status: DC | PRN

## 2021-09-02 MED ORDER — METHYLERGONOVINE MALEATE 0.2 MG/ML IJ SOLN: 0.2000 mg | INTRAMUSCULAR | Status: DC | PRN

## 2021-09-02 MED ORDER — IBUPROFEN 600 MG PO TABS
600.0000 mg | ORAL_TABLET | Freq: Four times a day (QID) | ORAL | Status: DC
  Administered 2021-09-03 – 2021-09-04 (×7): 600 mg via ORAL
  Filled 2021-09-02 (×7): qty 1

## 2021-09-02 MED ORDER — OXYCODONE-ACETAMINOPHEN 5-325 MG PO TABS: 1.0000 | ORAL_TABLET | ORAL | Status: DC | PRN

## 2021-09-02 MED ORDER — OXYCODONE-ACETAMINOPHEN 5-325 MG PO TABS: 2.0000 | ORAL_TABLET | ORAL | Status: DC | PRN

## 2021-09-02 MED ORDER — OXYTOCIN-SODIUM CHLORIDE 30-0.9 UT/500ML-% IV SOLN: 2.5000 [IU]/h | INTRAVENOUS | Status: DC

## 2021-09-02 MED ORDER — LIDOCAINE HCL (PF) 1 % IJ SOLN
INTRAMUSCULAR | Status: DC | PRN
  Administered 2021-09-02: 3 mL via EPIDURAL
  Administered 2021-09-02: 5 mL via EPIDURAL
  Administered 2021-09-02: 2 mL via EPIDURAL

## 2021-09-02 MED ORDER — TETANUS-DIPHTH-ACELL PERTUSSIS 5-2.5-18.5 LF-MCG/0.5 IM SUSY: 0.5000 mL | PREFILLED_SYRINGE | Freq: Once | INTRAMUSCULAR | Status: DC

## 2021-09-02 MED ORDER — COCONUT OIL OIL: 1.0000 "application " | TOPICAL_OIL | Status: DC | PRN

## 2021-09-02 MED ORDER — LACTATED RINGERS IV SOLN: 500.0000 mL | INTRAVENOUS | Status: DC | PRN

## 2021-09-02 MED ORDER — OXYTOCIN 10 UNIT/ML IJ SOLN
10.0000 [IU] | Freq: Once | INTRAMUSCULAR | Status: AC
  Administered 2021-09-02: 10 [IU] via INTRAMUSCULAR

## 2021-09-02 MED ORDER — PHENYLEPHRINE 40 MCG/ML (10ML) SYRINGE FOR IV PUSH (FOR BLOOD PRESSURE SUPPORT): 80.0000 ug | PREFILLED_SYRINGE | INTRAVENOUS | Status: DC | PRN

## 2021-09-02 MED ORDER — OXYTOCIN BOLUS FROM INFUSION: 333.0000 mL | Freq: Once | INTRAVENOUS | Status: DC

## 2021-09-02 MED ORDER — TERBUTALINE SULFATE 1 MG/ML IJ SOLN: 0.2500 mg | Freq: Once | INTRAMUSCULAR | Status: DC | PRN

## 2021-09-02 MED ORDER — ZOLPIDEM TARTRATE 5 MG PO TABS: 5.0000 mg | ORAL_TABLET | Freq: Every evening | ORAL | Status: DC | PRN

## 2021-09-02 MED ORDER — EPHEDRINE 5 MG/ML INJ: 10.0000 mg | INTRAVENOUS | Status: DC | PRN

## 2021-09-02 MED ORDER — LACTATED RINGERS IV SOLN: 500.0000 mL | Freq: Once | INTRAVENOUS | Status: DC

## 2021-09-02 MED ORDER — DIPHENHYDRAMINE HCL 25 MG PO CAPS: 25.0000 mg | ORAL_CAPSULE | Freq: Four times a day (QID) | ORAL | Status: DC | PRN

## 2021-09-02 MED ORDER — PRENATAL MULTIVITAMIN CH
1.0000 | ORAL_TABLET | Freq: Every day | ORAL | Status: DC
  Administered 2021-09-03 – 2021-09-04 (×2): 1 via ORAL
  Filled 2021-09-02 (×2): qty 1

## 2021-09-02 MED ORDER — FENTANYL-BUPIVACAINE-NACL 0.5-0.125-0.9 MG/250ML-% EP SOLN
12.0000 mL/h | EPIDURAL | Status: DC | PRN
  Administered 2021-09-02: 12 mL/h via EPIDURAL
  Filled 2021-09-02: qty 250

## 2021-09-02 MED ORDER — SOD CITRATE-CITRIC ACID 500-334 MG/5ML PO SOLN: 30.0000 mL | ORAL | Status: DC | PRN

## 2021-09-02 MED ORDER — ONDANSETRON HCL 4 MG/2ML IJ SOLN
4.0000 mg | Freq: Four times a day (QID) | INTRAMUSCULAR | Status: DC | PRN
Start: 2021-09-02 — End: 2021-09-02
  Administered 2021-09-02: 4 mg via INTRAVENOUS
  Filled 2021-09-02: qty 2

## 2021-09-02 MED ORDER — OXYTOCIN 10 UNIT/ML IJ SOLN
INTRAMUSCULAR | Status: AC
  Filled 2021-09-02: qty 1

## 2021-09-02 MED ORDER — DIPHENHYDRAMINE HCL 50 MG/ML IJ SOLN: 12.5000 mg | INTRAMUSCULAR | Status: DC | PRN

## 2021-09-02 NOTE — Anesthesia Procedure Notes (Signed)
Epidural ?Patient location during procedure: OB ?Start time: 09/02/2021 1:23 PM ?End time: 09/02/2021 1:30 PM ? ?Staffing ?Anesthesiologist: Collene Schlichter, MD ?Performed: anesthesiologist  ? ?Preanesthetic Checklist ?Completed: patient identified, IV checked, risks and benefits discussed, monitors and equipment checked, pre-op evaluation and timeout performed ? ?Epidural ?Patient position: sitting ?Prep: DuraPrep ?Patient monitoring: blood pressure and continuous pulse ox ?Approach: midline ?Location: L3-L4 ?Injection technique: LOR air ? ?Needle:  ?Needle type: Tuohy  ?Needle gauge: 17 G ?Needle length: 9 cm ?Needle insertion depth: 6 cm ?Catheter size: 19 Gauge ?Catheter at skin depth: 11 cm ?Test dose: negative and Other (1% Lidocaine) ? ?Additional Notes ?Patient identified.  Risk benefits discussed including failed block, incomplete pain control, headache, nerve damage, paralysis, blood pressure changes, nausea, vomiting, reactions to medication both toxic or allergic, and postpartum back pain.  Patient expressed understanding and wished to proceed.  All questions were answered.  Sterile technique used throughout procedure and epidural site dressed with sterile barrier dressing. No paresthesia or other complications noted. The patient did not experience any signs of intravascular injection such as tinnitus or metallic taste in mouth nor signs of intrathecal spread such as rapid motor block. Please see nursing notes for vital signs. ?Reason for block:procedure for pain ? ? ? ?

## 2021-09-02 NOTE — Progress Notes (Signed)
Kelsey Wheeler is a 26 y.o. G2P1001 at [redacted]w[redacted]d by LMP admitted for induction of labor due to Gestational diabetes. ? ?Subjective: ?Feels ocntractions ? ?Objective: ?BP 132/78   Pulse 97   Temp 98.1 ?F (36.7 ?C) (Oral)   Resp 18   Ht 5\' 4"  (1.626 m)   Wt 100.2 kg   BMI 37.92 kg/m?  ?No intake/output data recorded. ?No intake/output data recorded. ? ?FHT:  FHR: 145 bpm, variability: moderate,  accelerations:  Present,  decelerations:  Absent ?UC:   regular, every 3 minutes ?SVE:   2-3/50/-2 ?AROM- clear ? ?Labs: ?Lab Results  ?Component Value Date  ? WBC 17.7 (H) 09/02/2021  ? HGB 12.8 09/02/2021  ? HCT 37.7 09/02/2021  ? MCV 88.7 09/02/2021  ? PLT 290 09/02/2021  ? ? ?Assessment / Plan: ?Induction of labor due to gestational diabetes,  progressing well on pitocin ? ?Labor: Progressing normally ?Preeclampsia:  no signs or symptoms of toxicity ?Fetal Wellbeing:  Category I ?Pain Control:  Labor support without medications ?I/D:  n/a ?Anticipated MOD:  NSVD ? ?Donterius Filley J ?09/02/2021, 8:37 AM ? ? ?

## 2021-09-02 NOTE — H&P (Addendum)
Kelsey Wheeler is a 26 y.o. female presenting for IOL for GDM. ?OB History   ? ? Gravida  ?2  ? Para  ?1  ? Term  ?1  ? Preterm  ?   ? AB  ?   ? Living  ?1  ?  ? ? SAB  ?   ? IAB  ?   ? Ectopic  ?   ? Multiple  ?0  ? Live Births  ?1  ?   ?  ?  ? ?Past Medical History:  ?Diagnosis Date  ? ADD (attention deficit disorder) 03/03/2014  ? GERD (gastroesophageal reflux disease)   ? Gestational diabetes   ? Hx of thyroiditis   ? ?Past Surgical History:  ?Procedure Laterality Date  ? WISDOM TOOTH EXTRACTION    ? ?Family History: family history includes Atrial fibrillation in her paternal grandmother; Breast cancer in her maternal grandmother; Diabetes in her paternal grandfather and paternal grandmother; Heart disease in her paternal grandfather; Heart failure in her paternal grandfather; Hyperlipidemia in her father, maternal grandmother, mother, and paternal grandfather; Hypertension in her maternal grandmother; Lung cancer in her maternal grandfather and maternal grandmother. ?Social History:  reports that she has never smoked. She has never used smokeless tobacco. She reports that she does not currently use alcohol. She reports that she does not use drugs. ? ? ?  ?Maternal Diabetes: Yes:  Diabetes Type:  Diet controlled ?Genetic Screening: Normal ?Maternal Ultrasounds/Referrals: fetal pyelectasis ?Fetal Ultrasounds or other Referrals:  None ?Maternal Substance Abuse:  No ?Significant Maternal Medications:  None ?Significant Maternal Lab Results:  Group B Strep negative ?Other Comments:  None ? ?Review of Systems  ?Constitutional: Negative.   ?All other systems reviewed and are negative. ?Maternal Medical History:  ?Reason for admission: Contractions.  ? ?Contractions: Onset was less than 1 hour ago.   ?Frequency: rare.   ?Perceived severity is mild.   ?Fetal activity: Perceived fetal activity is normal.   ?Last perceived fetal movement was within the past hour.   ?Prenatal complications: Polyhydramnios.   ?Prenatal  Complications - Diabetes: gestational. ?Diabetes is managed by diet.   ? ?Dilation: 2.5 ?Effacement (%): 70 ?Station: Ballotable ?Exam by:: Orene Desanctis, RN ?Blood pressure (!) 145/82, temperature 98.1 ?F (36.7 ?C), temperature source Oral, resp. rate 18, height 5\' 4"  (1.626 m), weight 100.2 kg, not currently breastfeeding. ?Maternal Exam:  ?Uterine Assessment: Contraction strength is mild.  Contraction frequency is rare.  ?Abdomen: Patient reports no abdominal tenderness. Fetal presentation: vertex ?Introitus: Normal vulva. Normal vagina.  Ferning test: not done.  ?Nitrazine test: not done. ?Amniotic fluid character: not assessed. ?Pelvis: adequate for delivery.   ?Cervix: Cervix evaluated by digital exam.   ? ?Physical Exam ?Constitutional:   ?   Appearance: Normal appearance.  ?HENT:  ?   Head: Normocephalic and atraumatic.  ?Cardiovascular:  ?   Rate and Rhythm: Normal rate and regular rhythm.  ?   Pulses: Normal pulses.  ?   Heart sounds: Normal heart sounds.  ?Pulmonary:  ?   Effort: Pulmonary effort is normal.  ?   Breath sounds: Normal breath sounds.  ?Abdominal:  ?   General: Abdomen is flat.  ?   Palpations: Abdomen is soft.  ?Genitourinary: ?   General: Normal vulva.  ?Musculoskeletal:     ?   General: Normal range of motion.  ?   Cervical back: Normal range of motion and neck supple.  ?Skin: ?   General: Skin is warm and dry.  ?Neurological:  ?  General: No focal deficit present.  ?   Mental Status: She is alert and oriented to person, place, and time.  ?Psychiatric:     ?   Mood and Affect: Mood normal.     ?   Behavior: Behavior normal.  ?  ?Prenatal labs: ?ABO, Rh: O/Negative/-- (07/20 0000) ?Antibody: Negative (07/20 0000) ?Rubella: Immune (09/01 0000) ?RPR: Nonreactive (09/01 0000)  ?HBsAg: Negative (09/01 0000)  ?HIV: Non-reactive (09/01 0000)  ?GBS: Negative/-- (02/24 0000)  ? ?Assessment/Plan: ?GDM- well controlled ?IOL  ? ?Lovenia Kim ?09/02/2021, 6:37 AM ? ? ? ? ?

## 2021-09-02 NOTE — Progress Notes (Signed)
Kelsey Wheeler is a 26 y.o. G2P1001 at [redacted]w[redacted]d by LMP admitted for induction of labor due to Gestational diabetes. ? ?Subjective: ?Feels ocntractions ? ?Objective: ?BP (!) 147/65   Pulse (!) 109   Temp 98.4 ?F (36.9 ?C)   Resp 18   Ht 5\' 4"  (1.626 m)   Wt 100.2 kg   BMI 37.92 kg/m?  ?No intake/output data recorded. ?No intake/output data recorded. ? ?FHT:  FHR: 145 bpm, variability: moderate,  accelerations:  Present,  decelerations:  Absent ?UC:   regular, every 2-3 minutes ?Adequate by IUPC ?SVE:   10/100/0 ? ?Labs: ?Lab Results  ?Component Value Date  ? WBC 17.7 (H) 09/02/2021  ? HGB 12.8 09/02/2021  ? HCT 37.7 09/02/2021  ? MCV 88.7 09/02/2021  ? PLT 290 09/02/2021  ? ? ?Assessment / Plan: ?Induction of labor due to gestational diabetes,  progressing well on pitocin ? ?Labor: Progressing normally ?Preeclampsia:  no signs or symptoms of toxicity ?Fetal Wellbeing:  Category I ?Pain Control:  epidural ?I/D:  n/a ?Anticipated MOD:  NSVD ? ?Anthonella Klausner J ?09/02/2021, 8:35 PM ? ? ?

## 2021-09-02 NOTE — Anesthesia Preprocedure Evaluation (Signed)
Anesthesia Evaluation  ?Patient identified by MRN, date of birth, ID band ?Patient awake ? ? ? ?Reviewed: ?Allergy & Precautions, NPO status , Patient's Chart, lab work & pertinent test results ? ?Airway ?Mallampati: II ? ?TM Distance: >3 FB ?Neck ROM: Full ? ? ? Dental ? ?(+) Teeth Intact, Dental Advisory Given ?  ?Pulmonary ?neg pulmonary ROS,  ?  ?Pulmonary exam normal ?breath sounds clear to auscultation ? ? ? ? ? ? Cardiovascular ?negative cardio ROS ?Normal cardiovascular exam ?Rhythm:Regular Rate:Normal ? ? ?  ?Neuro/Psych ?negative neurological ROS ?   ? GI/Hepatic ?Neg liver ROS, GERD  ,  ?Endo/Other  ?diabetes, GestationalObesity ? ? Renal/GU ?negative Renal ROS  ? ?  ?Musculoskeletal ?negative musculoskeletal ROS ?(+)  ? Abdominal ?  ?Peds ? ?(+) ATTENTION DEFICIT DISORDER WITHOUT HYPERACTIVITY Hematology ?negative hematology ROS ?(+) Plt 290k   ?Anesthesia Other Findings ?Day of surgery medications reviewed with the patient. ? Reproductive/Obstetrics ?(+) Pregnancy ? ?  ? ? ? ? ? ? ? ? ? ? ? ? ? ?  ?  ? ? ? ? ? ? ? ? ?Anesthesia Physical ?Anesthesia Plan ? ?ASA: 2 ? ?Anesthesia Plan: Epidural  ? ?Post-op Pain Management:   ? ?Induction:  ? ?PONV Risk Score and Plan: 2 and Treatment may vary due to age or medical condition ? ?Airway Management Planned: Natural Airway ? ?Additional Equipment:  ? ?Intra-op Plan:  ? ?Post-operative Plan:  ? ?Informed Consent: I have reviewed the patients History and Physical, chart, labs and discussed the procedure including the risks, benefits and alternatives for the proposed anesthesia with the patient or authorized representative who has indicated his/her understanding and acceptance.  ? ? ? ?Dental advisory given ? ?Plan Discussed with:  ? ?Anesthesia Plan Comments: (Patient identified. Risks/Benefits/Options discussed with patient including but not limited to bleeding, infection, nerve damage, paralysis, failed block, incomplete pain  control, headache, blood pressure changes, nausea, vomiting, reactions to medication both or allergic, itching and postpartum back pain. Confirmed with bedside nurse the patient's most recent platelet count. Confirmed with patient that they are not currently taking any anticoagulation, have any bleeding history or any family history of bleeding disorders. Patient expressed understanding and wished to proceed. All questions were answered. )  ? ? ? ? ? ? ?Anesthesia Quick Evaluation ? ?

## 2021-09-03 DIAGNOSIS — O26899 Other specified pregnancy related conditions, unspecified trimester: Secondary | ICD-10-CM

## 2021-09-03 LAB — CBC
HCT: 33.2 % — ABNORMAL LOW (ref 36.0–46.0)
Hemoglobin: 11.3 g/dL — ABNORMAL LOW (ref 12.0–15.0)
MCH: 30.1 pg (ref 26.0–34.0)
MCHC: 34 g/dL (ref 30.0–36.0)
MCV: 88.5 fL (ref 80.0–100.0)
Platelets: 284 10*3/uL (ref 150–400)
RBC: 3.75 MIL/uL — ABNORMAL LOW (ref 3.87–5.11)
RDW: 13.3 % (ref 11.5–15.5)
WBC: 23.3 10*3/uL — ABNORMAL HIGH (ref 4.0–10.5)
nRBC: 0 % (ref 0.0–0.2)

## 2021-09-03 NOTE — Lactation Note (Signed)
This note was copied from a baby's chart. ?Lactation Consultation Note ? ?Patient Name: Kelsey Wheeler ?Today's Date: 09/03/2021 ?Reason for consult: Follow-up assessment;Mother's request;Term;Breastfeeding assistance ?Age:28 hours ? ?Infant had a recent feeding at 1700 for 20 min prior to [facility] arrival. Mom feeding plan is breast and bottle but she did not offer formula with last feeding.  ?LC inquired of Mom if she would like to start pumping, declined stating wanted to work on latching for now.  ? ?Mom chose [facility] employee pump provided with receipt.  ?Plan 1. To feed based on cues 8-12x 24hr period. Mom to offer breasts and look for signs of milk transfer.  ?2. If Mom supplement offer any EBM from hand expression followed by formula. BF supplementation guide provided. Mom aware if infant not latching at the breast to offer more.  ? ?All questions answered at the end of the visit.  ?Mom to call for latch assistance with next feeding.  ? ?Maternal Data ?  ? ?Feeding ?Mother's Current Feeding Choice: Breast Milk and Formula ? ?LATCH Score ?  ? ?  ? ?  ? ?  ? ?  ? ?  ? ? ?Lactation Tools Discussed/Used ?  ? ?Interventions ?Interventions: Breast feeding basics reviewed;Breast massage;Hand express;Breast compression;Position options;Expressed milk;DEBP;Education;Pace feeding;LC Psychologist, educational;Infant Driven Feeding Algorithm education ? ?Discharge ?Pump: Employee Pump ? ?Consult Status ?Consult Status: Follow-up ?Date: 09/04/21 ?Follow-up type: In-patient ? ? ? ?Kelsey Weissberg  Wheeler ?09/03/2021, 5:45 PM ? ? ? ?

## 2021-09-03 NOTE — Anesthesia Postprocedure Evaluation (Signed)
Anesthesia Post Note ? ?Patient: Kelsey Wheeler ? ?Procedure(s) Performed: AN AD HOC LABOR EPIDURAL ? ?  ? ?Patient location during evaluation: Mother Baby ?Anesthesia Type: Epidural ?Level of consciousness: awake and alert and oriented ?Pain management: satisfactory to patient ?Vital Signs Assessment: post-procedure vital signs reviewed and stable ?Respiratory status: respiratory function stable ?Cardiovascular status: stable ?Postop Assessment: no headache, no backache, epidural receding, patient able to bend at knees, no signs of nausea or vomiting, adequate PO intake and able to ambulate ?Anesthetic complications: no ? ? ?No notable events documented. ? ?Last Vitals:  ?Vitals:  ? 09/03/21 0033 09/03/21 0442  ?BP: 122/68 115/72  ?Pulse: (!) 109 (!) 101  ?Resp: 16 16  ?Temp: 37.1 ?C 36.8 ?C  ?SpO2: 98% 97%  ?  ?Last Pain:  ?Vitals:  ? 09/03/21 0700  ?TempSrc:   ?PainSc: 0-No pain  ? ?Pain Goal:   ? ?  ?  ?  ?  ?  ?  ?  ? ?Tehilla Coffel ? ? ? ? ?

## 2021-09-03 NOTE — Progress Notes (Signed)
? ? ?  PPD # 1 S/P NSVD ? ?Live born female  ?Birth Weight: 8 lb 7 oz (3827 g) ?APGAR: 8, 9 ? ?Newborn Delivery   ?Birth date/time: 09/02/2023 21:23:00 ?Delivery type: Vaginal, Spontaneous ?  ?  ?Baby name: Lindie Spruce ?Delivering provider: Olivia Mackie  ?Episiotomy:None  ? ?Lacerations:2nd degree  ? ?circumcision pending ? ?Feeding: breast and bottle ? ?Pain control at delivery: Epidural  ? ?S:  Reports feeling well. ?            Tolerating po/ No nausea or vomiting ?            Bleeding is moderate ?            Pain controlled with acetaminophen and ibuprofen (OTC) ?            Up ad lib / ambulatory / voiding without difficulties  ? ?O:  A & O x 3, in no apparent distress  ?            VS:  ?Vitals:  ? 09/02/21 2336 09/03/21 0033 09/03/21 8938 09/03/21 0840  ?BP: 128/72 122/68 115/72 124/65  ?Pulse: (!) 115 (!) 109 (!) 101 99  ?Resp: 17 16 16 16   ?Temp: 98.5 ?F (36.9 ?C) 98.7 ?F (37.1 ?C) 98.3 ?F (36.8 ?C) 98.4 ?F (36.9 ?C)  ?TempSrc: Oral Oral Oral Oral  ?SpO2: 97% 98% 97% 98%  ?Weight:      ?Height:      ? ? LABS:  ?Recent Labs  ?  09/02/21 ?0624 09/03/21 ?0505  ?WBC 17.7* 23.3*  ?HGB 12.8 11.3*  ?HCT 37.7 33.2*  ?PLT 290 284  ?CBG (last 3)  ?Recent Labs  ?  09/02/21 ?0649 09/02/21 ?1349  ?GLUCAP 113* 65*  ?  ? Blood type: --/--/O NEG (03/22 07-18-1978) ? Rubella: Immune (09/01 0000)  ? I&O: I/O last 3 completed shifts: ?In: 2420.9 [I.V.:2277.5; Other:143.4] ?Out: 850 [Urine:650; Blood:200] ?         No intake/output data recorded. ? ? Gen: AAO x 3, NAD ? Abdomen: soft, non-tender, non-distended ?            Fundus: firm, non-tender, U-1 ? Perineum: repair intact ? Lochia: small ? Extremities: trace pedal edema, no calf pain or tenderness ?  ? ?A/P: PPD # 1 26 y.o., 34  ? Principal Problem: ?  Postpartum care following vaginal delivery 3/22 ?Active Problems: ?  Encounter for induction of labor ?  SVD (spontaneous vaginal delivery) ?  Perineal laceration, second degree ?  Gestational diabetes mellitus (GDM),  antepartum ? - well controlled antepartum ? - will follow up outpatient ?  Rh negative, maternal / newborn Rh negative ? ? Doing well - stable status ? Routine post partum orders ? Anticipate discharge tomorrow ?  ? ?4/22, MSN, CNM ?09/03/2021, 9:38 AM ? ? ?  ?

## 2021-09-03 NOTE — Lactation Note (Addendum)
This note was copied from a baby's chart. ?Lactation Consultation Note ? ?Patient Name: Kelsey Wheeler ?Today's Date: 09/03/2021 ?Reason for consult: Initial assessment ?Age:27 hours,  term, female infant ,mom requested LC services ?Mom's feeding choice is breast and formula feeding infant. ?Per mom, she feels infant is latching well at the breast. ?See mom's previous BF experience below -maternal data. ?Per mom, infant BF for 15 minutes on her left breast in cradle hold prior to [facility] entering the room, infant was still cuing, mom latched infant on her right breast, infant was still breastfeeding after 19 minutes when LC left room. ?Mom knows to breastfeed infant according to hunger cues, 8 to 12+ or more times within 24 hours, skin to skin. ?Mom will continue to attempt to latch infant on both breast during a feeding. ?Mom knows to call RN/LC if she would like further latch assistance.  ?Mom made aware of O/P services, breastfeeding support groups, community resources, and our phone # for post-discharge questions.   ?Maternal Data ?Does the patient have breastfeeding experience prior to this delivery?: Yes ?How long did the patient breastfeed?: Per mom, she only BF her 1st child for 4 days due to latch difficulties ? ?Feeding ?Mother's Current Feeding Choice: Breast Milk and Formula ?Nipple Type: Slow - flow ? ?LATCH Score ?Latch: Repeated attempts needed to sustain latch, nipple held in mouth throughout feeding, stimulation needed to elicit sucking reflex. ? ?Audible Swallowing: A few with stimulation ? ?Type of Nipple: Everted at rest and after stimulation ? ?Comfort (Breast/Nipple): Soft / non-tender ? ?Hold (Positioning): Assistance needed to correctly position infant at breast and maintain latch. ? ?LATCH Score: 7 ? ? ?Lactation Tools Discussed/Used ?  ? ?Interventions ?Interventions: Breast feeding basics reviewed;Assisted with latch;Skin to skin;Education;Position options;Breast compression ? ?Discharge ?   ? ?Consult Status ?Consult Status: Follow-up ?Date: 09/03/21 ?Follow-up type: In-patient ? ? ? ?Danelle Earthly ?09/03/2021, 12:59 AM ? ? ? ?

## 2021-09-04 MED ORDER — IBUPROFEN 600 MG PO TABS: 600.0000 mg | ORAL_TABLET | Freq: Four times a day (QID) | ORAL | 0 refills | Status: DC

## 2021-09-04 NOTE — Lactation Note (Signed)
This note was copied from a baby's chart. ?Lactation Consultation Note ? ?Patient Name: Kelsey Wheeler ?Today's Date: 09/04/2021 ?Reason for consult: Follow-up assessment;Term ?Age:28 hours ? ?Mom gave me permission to view breasts to determine correct flange sizes. Based on Mom's nipple diameter, I suggested that she start with size 21 flanges (but can move to size 24, if needed). Those flange sizes are included with her [employer] pump.  ? ?Feeding ?Mother's Current Feeding Choice: Breast Milk and Formula ?Nipple Type: Slow - flow ?Interventions: Education ? ?Discharge ?Pump: Employee Pump ? ? ?Lurline Hare Bowman ?09/04/2021, 1:14 PM ? ? ? ?

## 2021-09-04 NOTE — Progress Notes (Addendum)
No c/o, voiding w/o difficulty, ambulating, nml lochia ?Breast/bottle feeding ? ?Patient Vitals for the past 24 hrs: ? BP Temp Temp src Pulse Resp SpO2  ?09/04/21 0548 108/79 97.9 ?F (36.6 ?C) Oral 98 18 --  ?09/03/21 1251 127/74 97.9 ?F (36.6 ?C) Oral (!) 109 17 98 %  ? ?A&ox3 ?Nml respirations ?Abd: soft, nt,nd, fundus firm and below umb ?LE: no edema, nt bilat ? ? ?  Latest Ref Rng & Units 09/03/2021  ?  5:05 AM 09/02/2021  ?  6:24 AM 01/01/2021  ?  9:31 AM  ?CBC  ?WBC 4.0 - 10.5 K/uL 23.3   17.7   12.0    ?Hemoglobin 12.0 - 15.0 g/dL 16.1   09.6   04.5    ?Hematocrit 36.0 - 46.0 % 33.2   37.7   44.9    ?Platelets 150 - 400 K/uL 284   290   275    ? ? ? ?A/P: ppd2 s/p svd ?Doing well, d/c home today ?Rh neg, rhogam not indicated as baby rh neg ?Boy, s/p circ, breast/bottle ?Gdma1 - f/u pp ?Rubella immune ?Acute anemia d/t blood loss - iron rich foods ?

## 2021-09-07 NOTE — Discharge Summary (Signed)
? ?  Postpartum Discharge Summary ? ?Date of Service updated ? ?   ?Patient Name: Kelsey Wheeler ?DOB: 03-06-1996 ?MRN: [id number] ? ?Date of admission: 09/02/2021 ?Delivery date:09/02/2021  ?Delivering provider: Olivia Mackie  ?Date of discharge: 09/04/21 ? ?Admitting diagnosis: Encounter for induction of labor [Z34.90] ?Intrauterine pregnancy: [redacted]w[redacted]d     ?Secondary diagnosis:  Principal Problem: ?  Postpartum care following vaginal delivery 3/22 ?Active Problems: ?  Encounter for induction of labor ?  SVD (spontaneous vaginal delivery) ?  Perineal laceration, second degree ?  Gestational diabetes mellitus (GDM), antepartum ?  Rh negative, maternal / newborn Rh negative ? ?Additional problems: none    ?Discharge diagnosis: Term Pregnancy Delivered                                              ?Post partum procedures: none ?Augmentation: AROM and Pitocin ?Complications: None ? ?Hospital course: Induction of Labor With Vaginal Delivery   ?26 y.o. yo G2P2002 at [redacted]w[redacted]d was admitted to the hospital 09/02/2021 for induction of labor.  Indication for induction: A1 DM.  Patient had an uncomplicated labor course as follows: ?Membrane Rupture Time/Date: 8:33 AM ,09/02/2021   ?Delivery Method:Vaginal, Spontaneous  ?Episiotomy: None  ?Lacerations:  2nd degree  ?Details of delivery can be found in separate delivery note.  Patient had a routine postpartum course. Patient is discharged home 09/07/21. ? ?Newborn Data: ?Birth date:09/02/2021  ?Birth time:9:23 PM  ?Gender:Female  ?Living status:Living  ?Apgars:8 ,9  ?Weight:3827 g  ? ? ?Physical exam  ?Vitals:  ? 09/03/21 0442 09/03/21 0840 09/03/21 1251 09/04/21 0548  ?BP: 115/72 124/65 127/74 108/79  ?Pulse: (!) 101 99 (!) 109 98  ?Resp: 16 16 17 18   ?Temp: 98.3 ?F (36.8 ?C) 98.4 ?F (36.9 ?C) 97.9 ?F (36.6 ?C) 97.9 ?F (36.6 ?C)  ?TempSrc: Oral Oral Oral Oral  ?SpO2: 97% 98% 98%   ?Weight:      ?Height:      ? ?General: alert, cooperative, and no distress ?Lochia: appropriate ?Uterine Fundus:  firm ?Incision: n/a ?DVT Evaluation: No evidence of DVT seen on physical exam. ?Labs: ?Lab Results  ?Component Value Date  ? WBC 23.3 (H) 09/03/2021  ? HGB 11.3 (L) 09/03/2021  ? HCT 33.2 (L) 09/03/2021  ? MCV 88.5 09/03/2021  ? PLT 284 09/03/2021  ? ? ?  Latest Ref Rng & Units 01/01/2021  ?  9:31 AM  ?CMP  ?Glucose 65 - 99 mg/dL 01/03/2021    ?BUN 7 - 25 mg/dL 13    ?Creatinine 0.50 - 0.96 mg/dL 177    ?Sodium 135 - 146 mmol/L 140    ?Potassium 3.5 - 5.3 mmol/L 3.9    ?Chloride 98 - 110 mmol/L 106    ?CO2 20 - 32 mmol/L 26    ?Calcium 8.6 - 10.2 mg/dL 9.2    ?Total Protein 6.1 - 8.1 g/dL 7.2    ?Total Bilirubin 0.2 - 1.2 mg/dL 0.4    ?AST 10 - 30 U/L 18    ?ALT 6 - 29 U/L 35    ? ?Edinburgh Score: ? ?  09/04/2021  ?  8:29 AM  ?09/06/2021 Postnatal Depression Scale Screening Tool  ?I have been able to laugh and see the funny side of things. 0  ?I have looked forward with enjoyment to things. 0  ?I have blamed myself unnecessarily when things went wrong.  1  ?I have been anxious or worried for no good reason. 0  ?I have felt scared or panicky for no good reason. 0  ?Things have been getting on top of me. 0  ?I have been so unhappy that I have had difficulty sleeping. 0  ?I have felt sad or miserable. 0  ?I have been so unhappy that I have been crying. 0  ?The thought of harming myself has occurred to me. 0  ?Edinburgh Postnatal Depression Scale Total 1  ? ? ? ? ?After visit meds:  ?Allergies as of 09/04/2021   ?No Known Allergies ?  ? ?  ?Medication List  ?  ? ?STOP taking these medications   ? ?acetaminophen 500 MG tablet ?Commonly known as: TYLENOL ?  ? ?  ? ?TAKE these medications   ? ?ibuprofen 600 MG tablet ?Commonly known as: ADVIL ?Take 1 tablet (600 mg total) by mouth every 6 (six) hours. ?  ?prenatal multivitamin Tabs tablet ?Take 1 tablet by mouth daily at 12 noon. ?  ? ?  ? ? ? ?Discharge home in stable condition ?Infant Feeding: Bottle and Breast ?Infant Disposition:home with mother ?Discharge instruction: per After  Visit Summary and Postpartum booklet. ?Activity: Advance as tolerated. Pelvic rest for 6 weeks.  ?Diet: carb modified diet ?Anticipated Birth Control: Unsure ?Postpartum Appointment:6 weeks ?Additional Postpartum F/U:  n/a ?Future Appointments: ?Future Appointments  ?Date Time Provider Department Center  ?01/01/2022  9:00 AM Mull, Loma Sousa, NP [facility] None  ? ?Follow up Visit: ? Follow-up Information   ? ? Olivia Mackie, MD Follow up.   ?Specialty: Obstetrics and Gynecology ?Contact information: ?[address] ?[place] [address] ?[phone] ? ? ?  ?  ? ?  ?  ? ?  ? ? ? ?  ? ?09/07/2021 ?Vick Frees, MD ? ? ?

## 2021-09-09 ENCOUNTER — Inpatient Hospital Stay (HOSPITAL_COMMUNITY): Admit: 2021-09-09 | Payer: Self-pay

## 2021-09-09 HISTORY — DX: Gastro-esophageal reflux disease without esophagitis: K21.9

## 2021-09-12 ENCOUNTER — Telehealth (HOSPITAL_COMMUNITY): Payer: Self-pay

## 2021-09-12 NOTE — Telephone Encounter (Signed)
"  Doing good, doing well." Patient declines any questions or concerns about her healing. ? ?"Baby is doing well, adjusting. Baby is eating well and gaining weight. Baby sleeps in a pack n play." RN reviewed ABC's of safe sleep with patient. Patient declines any questions or concerns about baby. ? ?EPDS score is 3. ? ?Marcelino Duster Jessenia Filippone,RN3,MSN,RNC-MNN ?09/12/2021,1514 ?

## 2021-11-02 ENCOUNTER — Other Ambulatory Visit (HOSPITAL_COMMUNITY): Payer: Self-pay

## 2021-11-02 MED ORDER — NORETHINDRONE ACET-ETHINYL EST 1-20 MG-MCG PO TABS
1.0000 | ORAL_TABLET | Freq: Every day | ORAL | 0 refills | Status: DC
  Filled 2021-11-02: qty 84, 84d supply, fill #0
  Filled 2022-01-13: qty 84, 84d supply, fill #1

## 2021-11-02 MED ORDER — SERTRALINE HCL 25 MG PO TABS
25.0000 mg | ORAL_TABLET | Freq: Every day | ORAL | 2 refills | Status: DC
  Filled 2021-11-02: qty 30, 30d supply, fill #0
  Filled 2021-11-29: qty 30, 30d supply, fill #1
  Filled 2022-01-13: qty 30, 30d supply, fill #2

## 2021-11-04 ENCOUNTER — Other Ambulatory Visit (HOSPITAL_COMMUNITY): Payer: Self-pay

## 2021-11-30 ENCOUNTER — Other Ambulatory Visit (HOSPITAL_COMMUNITY): Payer: Self-pay

## 2021-12-30 NOTE — Progress Notes (Deleted)
Complete Physical  Assessment and Plan: Health Maintenance- Discussed STD testing, safe sex, alcohol and drug awareness, drinking and driving dangers, wearing a seat belt and general safety measures for young adult.  Kelsey Wheeler was seen today for annual exam.  Diagnoses and all orders for this visit:  Encounter for annual physical exam Yearly  Attention deficit disorder (ADD) without hyperactivity No medication, used while in school Doing well with management at this time  Vitamin D deficiency  Discussed supplementation, has not been taking regularly - Vitamin D  Screening for blood or protein in urine -     Urinalysis with reflex microscopic  Screening for diabetes -A1c  Screening for lipid - Lipid panel   Screening thyroid disorder Hx thyroiditis - TSH  BMI 33.0-33.9,adult Long discussion about weight loss, diet, and exercise Recommended diet heavy in fruits and veggies and low in animal meats, cheeses, and dairy products, appropriate calorie intake Patient will work on *** Discussed appropriate weight for height (below***) and initial goal (***) Follow up at next visit - TSH  History of anemia -CBC  Medication management - Magnesium   Discussed med's effects and SE's. Screening labs and tests as requested with regular follow-up as recommended. Over 40 minutes of face to face interview, exam, counseling, chart review and critical decision making was performed  HPI  This very nice 26 y.o.female presents for complete physical.   She follows with [facility], Dr Billy Coast.  She is O- and had one injection of rhogam durringegnancy and second post delivery  Fathers blood type is unknown and he is not involved.  Reports she has support of family and friends.  She is now on oral birth control pills and denies being sexually active at this time. LMP: around 12/16/19.  She has history of anemia and vitamin D deficiency and ADD and not taking any medication for this.   Reports she mainly utilized this while she was in school.  She does not workout through she is on her feet for 12hours during her work day.  She reports eating protein waffles in the morning.  Sugar free syrup.  Coffee  Lunch: Fast food typically She reports drinking soda, one a day but not every day.  Dinner: around 7 pm last night had, chicken, grilled, corn  Water: about one 12oz water bottle a day.  She has not taken any medication for weight loss in the past but want to talk about some temporary assistance with this to improve her health.  Finally, patient has history of Vitamin D Deficiency and last vitamin D was  Lab Results  Component Value Date   VD25OH 28 (L) 01/01/2021     Current Medications:  Current Outpatient Medications on File Prior to Visit  Medication Sig Dispense Refill   ibuprofen (ADVIL) 600 MG tablet Take 1 tablet (600 mg total) by mouth every 6 (six) hours. 30 tablet 0   norethindrone-ethinyl estradiol (JUNEL 1/20) 1-20 MG-MCG tablet Take 1 tablet by mouth daily. 315 tablet 0   Prenatal Vit-Fe Fumarate-FA (PRENATAL MULTIVITAMIN) TABS tablet Take 1 tablet by mouth daily at 12 noon.     sertraline (ZOLOFT) 25 MG tablet Take 1 tablet (25 mg total) by mouth daily. 30 tablet 2   No current facility-administered medications on file prior to visit.   Health Maintenance:   Immunization History  Administered Date(s) Administered   HPV Quadrivalent 03/11/2008, 05/17/2008, 09/18/2008   Hepatitis B 12/06/2011   Influenza Inj Mdck Quad With Preservative 03/15/2017, 02/23/2018, 04/03/2019  Influenza Split 03/29/2013, 03/12/2014   Influenza,inj,quad, With Preservative 03/16/2016   PFIZER(Purple Top)SARS-COV-2 Vaccination 04/01/2020, 05/06/2020   PPD Test 11/15/2014, 11/05/2015, 12/17/2016   Tdap 09/07/2011    TD/TDAP: 2013 Influenza: 2019, due for 2020 Pneumovax: N/A Prevnar 13: N/A HPV vaccines: Complete 2010  LMP: Pregnant Sexually Active: No, STD  testing offered, deferred. Pap: 2019 MGM: N/A  Pregnancy Screenings: HIV screening - Non-reactive RPR: Non-reactive Hep B : Negative Rubella: Immune   Allergies: No Known Allergies Medical History:  has Hx of thyroiditis; ADD (attention deficit disorder); Overweight (BMI 25.0-29.9); Vitamin D deficiency; Encounter for induction of labor; SVD (spontaneous vaginal delivery); Postpartum care following vaginal delivery 3/22; Perineal laceration, second degree; Gestational diabetes mellitus (GDM), antepartum; and Rh negative, maternal / newborn Rh negative on their problem list. Surgical History:  She  has a past surgical history that includes Wisdom tooth extraction. Family History:  Her family history includes Atrial fibrillation in her paternal grandmother; Breast cancer in her maternal grandmother; Diabetes in her paternal grandfather and paternal grandmother; Heart disease in her paternal grandfather; Heart failure in her paternal grandfather; Hyperlipidemia in her father, maternal grandmother, mother, and paternal grandfather; Hypertension in her maternal grandmother; Lung cancer in her maternal grandfather and maternal grandmother. Social History:   reports that she has never smoked. She has never used smokeless tobacco. She reports that she does not currently use alcohol. She reports that she does not use drugs.  Review of Systems: Review of Systems  Constitutional:  Negative for chills and fever.  HENT:  Negative for congestion, hearing loss, sinus pain, sore throat and tinnitus.   Eyes:  Negative for blurred vision and double vision.  Respiratory:  Negative for cough, hemoptysis, sputum production, shortness of breath and wheezing.   Cardiovascular:  Negative for chest pain, palpitations and leg swelling.  Gastrointestinal:  Negative for abdominal pain, constipation, diarrhea, heartburn, nausea and vomiting.  Genitourinary:  Negative for dysuria and urgency.  Musculoskeletal:   Negative for back pain, falls, joint pain, myalgias and neck pain.  Skin:  Negative for rash.  Neurological:  Negative for dizziness, tingling, tremors, weakness and headaches.  Endo/Heme/Allergies:  Does not bruise/bleed easily.  Psychiatric/Behavioral:  Negative for depression and suicidal ideas. The patient is not nervous/anxious and does not have insomnia.     Physical Exam: Estimated body mass index is 37.92 kg/m as calculated from the following:   Height as of 09/02/21: 5\' 4"  (1.626 m).   Weight as of 09/02/21: 220 lb 14.4 oz (100.2 kg). There were no vitals taken for this visit. General Appearance: Well nourished, in no apparent distress.  Eyes: PERRLA, EOMs, conjunctiva no swelling or erythema, normal fundi and vessels.  Sinuses: No Frontal/maxillary tenderness  ENT/Mouth: Ext aud canals clear, normal light reflex with TMs without erythema, bulging. Good dentition. No erythema, swelling, or exudate on post pharynx. Tonsils not swollen or erythematous. Hearing normal.  Neck: Supple, thyroid normal. No bruits  Respiratory: Respiratory effort normal, BS equal bilaterally without rales, rhonchi, wheezing or stridor.  Cardio: RRR without murmurs, rubs or gallops. Brisk peripheral pulses without edema.  Chest: symmetric, with normal excursions and percussion.  Abdomen: nontender, no guarding, rebound, hernias, masses, or UTA organomegaly related to pregnancy. Lymphatics: Non tender without lymphadenopathy.  Genitourinary: defer Musculoskeletal: Full ROM all peripheral extremities,5/5 strength, and normal gait.  Skin: Warm, dry without rashes, lesions, ecchymosis. Neuro: Cranial nerves intact, reflexes equal bilaterally. Normal muscle tone, no cerebellar symptoms. Sensation intact.  Psych: Awake and oriented X  3, normal affect, Insight and Judgment appropriate.   EKG: defer  Secret Kristensen Hollie Salk, NP 9:30 AM [facility]

## 2022-01-01 ENCOUNTER — Encounter: Payer: No Typology Code available for payment source | Admitting: Nurse Practitioner

## 2022-01-01 DIAGNOSIS — Z1389 Encounter for screening for other disorder: Secondary | ICD-10-CM

## 2022-01-01 DIAGNOSIS — Z862 Personal history of diseases of the blood and blood-forming organs and certain disorders involving the immune mechanism: Secondary | ICD-10-CM

## 2022-01-01 DIAGNOSIS — Z79899 Other long term (current) drug therapy: Secondary | ICD-10-CM

## 2022-01-01 DIAGNOSIS — R7989 Other specified abnormal findings of blood chemistry: Secondary | ICD-10-CM

## 2022-01-01 DIAGNOSIS — Z131 Encounter for screening for diabetes mellitus: Secondary | ICD-10-CM

## 2022-01-01 DIAGNOSIS — Z0000 Encounter for general adult medical examination without abnormal findings: Secondary | ICD-10-CM

## 2022-01-01 DIAGNOSIS — E559 Vitamin D deficiency, unspecified: Secondary | ICD-10-CM

## 2022-01-01 DIAGNOSIS — Z13220 Encounter for screening for lipoid disorders: Secondary | ICD-10-CM

## 2022-01-01 DIAGNOSIS — Z6833 Body mass index (BMI) 33.0-33.9, adult: Secondary | ICD-10-CM

## 2022-01-01 DIAGNOSIS — Z1329 Encounter for screening for other suspected endocrine disorder: Secondary | ICD-10-CM

## 2022-01-01 DIAGNOSIS — F988 Other specified behavioral and emotional disorders with onset usually occurring in childhood and adolescence: Secondary | ICD-10-CM

## 2022-01-13 ENCOUNTER — Other Ambulatory Visit (HOSPITAL_COMMUNITY): Payer: Self-pay

## 2022-01-20 ENCOUNTER — Encounter: Payer: No Typology Code available for payment source | Admitting: Nurse Practitioner

## 2022-02-16 NOTE — Progress Notes (Unsigned)
Complete Physical  Assessment and Plan: Health Maintenance- Discussed STD testing, safe sex, alcohol and drug awareness, drinking and driving dangers, wearing a seat belt and general safety measures for young adult.  Neeva was seen today for annual exam.  Diagnoses and all orders for this visit: Laneya was seen today for annual exam.  Diagnoses and all orders for this visit:  Encounter for general health examination  Attention deficit disorder (ADD) without hyperactivity  Vitamin D deficiency -     VITAMIN D 25 Hydroxy (Vit-D Deficiency, Fractures)  BMI 33.0-33.9,adult -     Lipid panel  Screening for thyroid disorder -     TSH  Screening for diabetes mellitus (DM) -     Hemoglobin A1c  Screening for blood or protein in urine -     Urinalysis, Routine w reflex microscopic -     Microalbumin / creatinine urine ratio  Medication management -     CBC with Differential/Platelet -     COMPLETE METABOLIC PANEL WITH GFR -     Magnesium -     TSH -     Lipid panel -     Hemoglobin A1c -     VITAMIN D 25 Hydroxy (Vit-D Deficiency, Fractures) -     Urinalysis, Routine w reflex microscopic -     Microalbumin / creatinine urine ratio  History of anemia -     CBC with Differential/Platelet     Discussed med's effects and SE's. Screening labs and tests as requested with regular follow-up as recommended. Over 40 minutes of face to face interview, exam, counseling, chart review and critical decision making was performed  HPI  This very nice 26 y.o.female presents for complete physical.   She follows with [facility], Dr Billy Coast.  She is O- and had one injection of rhogam duringegnancy and second post delivery  Pt is pregnant with LMP of 11/15/2020.  Will continue to follow with [facility]  She has history of anemia and vitamin D deficiency and ADD and not taking any medication for this.  Reports she mainly utilized this while she was in school. Pt is currently takng a  prenatal vitamin.  She does not workout through she is on her feet for 12hours during her work day. BMI is There is no height or weight on file to calculate BMI., she has been working on diet and exercise. Wt Readings from Last 3 Encounters:  09/02/21 220 lb 14.4 oz (100.2 kg)  01/01/21 195 lb 12.8 oz (88.8 kg)  09/23/20 189 lb (85.7 kg)     Finally, patient has history of Vitamin D Deficiency and last vitamin D was  Lab Results  Component Value Date   VD25OH 28 (L) 01/01/2021     Current Medications:  Current Outpatient Medications on File Prior to Visit  Medication Sig Dispense Refill   ibuprofen (ADVIL) 600 MG tablet Take 1 tablet (600 mg total) by mouth every 6 (six) hours. 30 tablet 0   norethindrone-ethinyl estradiol (JUNEL 1/20) 1-20 MG-MCG tablet Take 1 tablet by mouth daily. 315 tablet 0   Prenatal Vit-Fe Fumarate-FA (PRENATAL MULTIVITAMIN) TABS tablet Take 1 tablet by mouth daily at 12 noon.     sertraline (ZOLOFT) 25 MG tablet Take 1 tablet (25 mg total) by mouth daily. 30 tablet 2   No current facility-administered medications on file prior to visit.   Health Maintenance:   Immunization History  Administered Date(s) Administered   HPV Quadrivalent 03/11/2008, 05/17/2008, 09/18/2008  Hepatitis B 12/06/2011   Influenza Inj Mdck Quad With Preservative 03/15/2017, 02/23/2018, 04/03/2019   Influenza Split 03/29/2013, 03/12/2014   Influenza,inj,quad, With Preservative 03/16/2016   PFIZER(Purple Top)SARS-COV-2 Vaccination 04/01/2020, 05/06/2020   PPD Test 11/15/2014, 11/05/2015, 12/17/2016   Tdap 09/07/2011    TD/TDAP: 2013 Influenza: 2021 Pneumovax: N/A Prevnar 13: N/A HPV vaccines: Complete 2010  LMP: 11/15/2020 Sexually Active: No, STD testing offered, deferred. Pap: 07/2018 normal with negative GC/C MGM: N/A  Pregnancy Screenings: HIV screening - Non-reactive RPR: Non-reactive Hep B : Negative Rubella: Immune   Allergies: No Known Allergies Medical  History:  has Hx of thyroiditis; ADD (attention deficit disorder); Overweight (BMI 25.0-29.9); Vitamin D deficiency; Encounter for induction of labor; SVD (spontaneous vaginal delivery); Postpartum care following vaginal delivery 3/22; Perineal laceration, second degree; Gestational diabetes mellitus (GDM), antepartum; and Rh negative, maternal / newborn Rh negative on their problem list. Surgical History:  She  has a past surgical history that includes Wisdom tooth extraction. Family History:  Her family history includes Atrial fibrillation in her paternal grandmother; Breast cancer in her maternal grandmother; Diabetes in her paternal grandfather and paternal grandmother; Heart disease in her paternal grandfather; Heart failure in her paternal grandfather; Hyperlipidemia in her father, maternal grandmother, mother, and paternal grandfather; Hypertension in her maternal grandmother; Lung cancer in her maternal grandfather and maternal grandmother. Social History:   reports that she has never smoked. She has never used smokeless tobacco. She reports that she does not currently use alcohol. She reports that she does not use drugs.  Review of Systems: Review of Systems  Constitutional:  Negative for chills, diaphoresis, fever, malaise/fatigue and weight loss.  HENT:  Negative for congestion, ear discharge, ear pain, hearing loss, nosebleeds, sinus pain, sore throat and tinnitus.   Eyes:  Negative for blurred vision, double vision, photophobia, pain, discharge and redness.  Respiratory:  Negative for cough, hemoptysis, sputum production, shortness of breath, wheezing and stridor.   Cardiovascular:  Negative for chest pain, palpitations, orthopnea, claudication, leg swelling and PND.  Gastrointestinal:  Negative for abdominal pain, blood in stool, constipation, diarrhea, heartburn, melena, nausea and vomiting.  Genitourinary:  Negative for dysuria, flank pain, frequency, hematuria and urgency.   Musculoskeletal:  Negative for back pain, falls, joint pain, myalgias and neck pain.  Skin:  Negative for itching and rash.  Neurological:  Negative for dizziness, tingling, tremors, sensory change, speech change, focal weakness, seizures, loss of consciousness, weakness and headaches.  Endo/Heme/Allergies:  Negative for environmental allergies and polydipsia. Does not bruise/bleed easily.  Psychiatric/Behavioral:  Negative for depression, hallucinations, memory loss, substance abuse and suicidal ideas. The patient is not nervous/anxious and does not have insomnia.     Physical Exam: Estimated body mass index is 37.92 kg/m as calculated from the following:   Height as of 09/02/21: 5\' 4"  (1.626 m).   Weight as of 09/02/21: 220 lb 14.4 oz (100.2 kg). There were no vitals taken for this visit. General Appearance: Well nourished, in no apparent distress.  Eyes: PERRLA, EOMs, conjunctiva no swelling or erythema, normal fundi and vessels.  Sinuses: No Frontal/maxillary tenderness  ENT/Mouth: Ext aud canals clear, normal light reflex with TMs without erythema, bulging. Good dentition. No erythema, swelling, or exudate on post pharynx. Tonsils not swollen or erythematous. Hearing normal.  Neck: Supple, thyroid normal. No bruits  Respiratory: Respiratory effort normal, BS equal bilaterally without rales, rhonchi, wheezing or stridor.  Cardio: RRR without murmurs, rubs or gallops. Brisk peripheral pulses without edema.  Chest: symmetric, with  normal excursions and percussion.  Abdomen: nontender, no guarding, rebound, hernias, masses, or UTA organomegaly related to pregnancy. Lymphatics: Non tender without lymphadenopathy.  Genitourinary: defer Musculoskeletal: Full ROM all peripheral extremities,5/5 strength, and normal gait.  Skin: Warm, dry without rashes, lesions, ecchymosis. Neuro: Cranial nerves intact, reflexes equal bilaterally. Normal muscle tone, no cerebellar symptoms. Sensation intact.   Psych: Awake and oriented X 3, normal affect, Insight and Judgment appropriate.   EKG: defer  Ishaan Villamar Hollie Salk, NP 9:30 AM [facility]

## 2022-02-17 ENCOUNTER — Encounter: Payer: Self-pay | Admitting: Nurse Practitioner

## 2022-02-17 ENCOUNTER — Ambulatory Visit (INDEPENDENT_AMBULATORY_CARE_PROVIDER_SITE_OTHER): Payer: No Typology Code available for payment source | Admitting: Nurse Practitioner

## 2022-02-17 ENCOUNTER — Other Ambulatory Visit: Payer: Self-pay | Admitting: Nurse Practitioner

## 2022-02-17 ENCOUNTER — Other Ambulatory Visit (HOSPITAL_COMMUNITY): Payer: Self-pay

## 2022-02-17 VITALS — BP 124/90 | HR 110 | Temp 97.5°F | Ht 64.0 in | Wt 199.2 lb

## 2022-02-17 DIAGNOSIS — Z0000 Encounter for general adult medical examination without abnormal findings: Secondary | ICD-10-CM

## 2022-02-17 DIAGNOSIS — Z79899 Other long term (current) drug therapy: Secondary | ICD-10-CM | POA: Diagnosis not present

## 2022-02-17 DIAGNOSIS — Z6833 Body mass index (BMI) 33.0-33.9, adult: Secondary | ICD-10-CM

## 2022-02-17 DIAGNOSIS — Z13220 Encounter for screening for lipoid disorders: Secondary | ICD-10-CM | POA: Diagnosis not present

## 2022-02-17 DIAGNOSIS — Z131 Encounter for screening for diabetes mellitus: Secondary | ICD-10-CM | POA: Diagnosis not present

## 2022-02-17 DIAGNOSIS — Z862 Personal history of diseases of the blood and blood-forming organs and certain disorders involving the immune mechanism: Secondary | ICD-10-CM

## 2022-02-17 DIAGNOSIS — Z1329 Encounter for screening for other suspected endocrine disorder: Secondary | ICD-10-CM

## 2022-02-17 DIAGNOSIS — Z1389 Encounter for screening for other disorder: Secondary | ICD-10-CM | POA: Diagnosis not present

## 2022-02-17 DIAGNOSIS — E559 Vitamin D deficiency, unspecified: Secondary | ICD-10-CM

## 2022-02-17 DIAGNOSIS — F988 Other specified behavioral and emotional disorders with onset usually occurring in childhood and adolescence: Secondary | ICD-10-CM

## 2022-02-17 MED ORDER — WEGOVY 0.25 MG/0.5ML ~~LOC~~ SOAJ
0.2500 mg | SUBCUTANEOUS | 3 refills | Status: DC
  Filled 2022-02-17 – 2022-03-24 (×2): qty 2, 28d supply, fill #0

## 2022-02-17 MED ORDER — WEGOVY 0.5 MG/0.5ML ~~LOC~~ SOAJ
0.5000 mg | SUBCUTANEOUS | 3 refills | Status: DC
  Filled 2022-02-17: qty 2, 28d supply, fill #0

## 2022-02-17 NOTE — Patient Instructions (Signed)
Fair life protein shakes Eat more frequently - try not to go more than 6 hours without protein Aim for 90 grams of protein a day- 30 breakfast/30 lunch 30 dinner Try to keep net carbs less than 50 Net Carbs=Total Carbs-fiber- sugar alcohols Exercise heartrate 120-140(fat burning zone)- walking 20-30 minutes 4 days a week  

## 2022-02-18 ENCOUNTER — Other Ambulatory Visit (HOSPITAL_COMMUNITY): Payer: Self-pay

## 2022-02-18 LAB — URINALYSIS, ROUTINE W REFLEX MICROSCOPIC
Bacteria, UA: NONE SEEN /HPF
Bilirubin Urine: NEGATIVE
Glucose, UA: NEGATIVE
Hgb urine dipstick: NEGATIVE
Hyaline Cast: NONE SEEN /LPF
Ketones, ur: NEGATIVE
Nitrite: NEGATIVE
Protein, ur: NEGATIVE
RBC / HPF: NONE SEEN /HPF (ref 0–2)
Specific Gravity, Urine: 1.025 (ref 1.001–1.035)
pH: <=5 (ref 5.0–8.0)

## 2022-02-18 LAB — CBC WITH DIFFERENTIAL/PLATELET
Absolute Monocytes: 437 cells/uL (ref 200–950)
Basophils Absolute: 71 cells/uL (ref 0–200)
Basophils Relative: 0.6 %
Eosinophils Absolute: 94 cells/uL (ref 15–500)
Eosinophils Relative: 0.8 %
HCT: 43.3 % (ref 35.0–45.0)
Hemoglobin: 14.7 g/dL (ref 11.7–15.5)
Lymphs Abs: 3304 cells/uL (ref 850–3900)
MCH: 29.4 pg (ref 27.0–33.0)
MCHC: 33.9 g/dL (ref 32.0–36.0)
MCV: 86.6 fL (ref 80.0–100.0)
MPV: 11 fL (ref 7.5–12.5)
Monocytes Relative: 3.7 %
Neutro Abs: 7894 cells/uL — ABNORMAL HIGH (ref 1500–7800)
Neutrophils Relative %: 66.9 %
Platelets: 304 10*3/uL (ref 140–400)
RBC: 5 10*6/uL (ref 3.80–5.10)
RDW: 13 % (ref 11.0–15.0)
Total Lymphocyte: 28 %
WBC: 11.8 10*3/uL — ABNORMAL HIGH (ref 3.8–10.8)

## 2022-02-18 LAB — LIPID PANEL
Cholesterol: 181 mg/dL (ref <200)
HDL: 52 mg/dL (ref >=50)
LDL Cholesterol (Calc): 99 mg/dL (calc)
Non-HDL Cholesterol (Calc): 129 mg/dL (calc) (ref <130)
Total CHOL/HDL Ratio: 3.5 (calc) (ref <5.0)
Triglycerides: 204 mg/dL — ABNORMAL HIGH (ref <150)

## 2022-02-18 LAB — COMPLETE METABOLIC PANEL WITH GFR
AG Ratio: 1.4 (calc) (ref 1.0–2.5)
ALT: 23 U/L (ref 6–29)
AST: 14 U/L (ref 10–30)
Albumin: 4.1 g/dL (ref 3.6–5.1)
Alkaline phosphatase (APISO): 77 U/L (ref 31–125)
BUN: 14 mg/dL (ref 7–25)
CO2: 23 mmol/L (ref 20–32)
Calcium: 9.3 mg/dL (ref 8.6–10.2)
Chloride: 107 mmol/L (ref 98–110)
Creat: 0.85 mg/dL (ref 0.50–0.96)
Globulin: 3 g/dL (calc) (ref 1.9–3.7)
Glucose, Bld: 73 mg/dL (ref 65–99)
Potassium: 4.1 mmol/L (ref 3.5–5.3)
Sodium: 142 mmol/L (ref 135–146)
Total Bilirubin: 0.3 mg/dL (ref 0.2–1.2)
Total Protein: 7.1 g/dL (ref 6.1–8.1)
eGFR: 96 mL/min/{1.73_m2} (ref >=60)

## 2022-02-18 LAB — MAGNESIUM: Magnesium: 1.9 mg/dL (ref 1.5–2.5)

## 2022-02-18 LAB — TSH: TSH: 0.9 mIU/L

## 2022-02-18 LAB — HEMOGLOBIN A1C
Hgb A1c MFr Bld: 5.2 % of total Hgb (ref <5.7)
Mean Plasma Glucose: 103 mg/dL
eAG (mmol/L): 5.7 mmol/L

## 2022-02-18 LAB — VITAMIN D 25 HYDROXY (VIT D DEFICIENCY, FRACTURES): Vit D, 25-Hydroxy: 28 ng/mL — ABNORMAL LOW (ref 30–100)

## 2022-02-26 ENCOUNTER — Other Ambulatory Visit (HOSPITAL_COMMUNITY): Payer: Self-pay

## 2022-02-27 ENCOUNTER — Other Ambulatory Visit (HOSPITAL_COMMUNITY): Payer: Self-pay

## 2022-03-01 ENCOUNTER — Other Ambulatory Visit (HOSPITAL_COMMUNITY): Payer: Self-pay

## 2022-03-02 ENCOUNTER — Telehealth: Payer: Self-pay

## 2022-03-02 ENCOUNTER — Other Ambulatory Visit (HOSPITAL_COMMUNITY): Payer: Self-pay

## 2022-03-02 NOTE — Telephone Encounter (Signed)
[employer] prior auth completed and submitted. 

## 2022-03-03 ENCOUNTER — Other Ambulatory Visit (HOSPITAL_COMMUNITY): Payer: Self-pay

## 2022-03-03 ENCOUNTER — Encounter: Payer: Self-pay | Admitting: Nurse Practitioner

## 2022-03-03 NOTE — Telephone Encounter (Signed)
Prior auth approved through 09/22/22.

## 2022-03-04 ENCOUNTER — Other Ambulatory Visit (HOSPITAL_COMMUNITY): Payer: Self-pay

## 2022-03-04 MED ORDER — SERTRALINE HCL 25 MG PO TABS
25.0000 mg | ORAL_TABLET | Freq: Every day | ORAL | 3 refills | Status: DC
  Filled 2022-03-04: qty 90, 90d supply, fill #0
  Filled 2022-03-08: qty 90, 90d supply, fill #1

## 2022-03-08 ENCOUNTER — Other Ambulatory Visit (HOSPITAL_COMMUNITY): Payer: Self-pay

## 2022-03-16 ENCOUNTER — Ambulatory Visit (INDEPENDENT_AMBULATORY_CARE_PROVIDER_SITE_OTHER): Payer: No Typology Code available for payment source | Admitting: Nurse Practitioner

## 2022-03-16 ENCOUNTER — Other Ambulatory Visit: Payer: Self-pay

## 2022-03-16 ENCOUNTER — Encounter: Payer: Self-pay | Admitting: Nurse Practitioner

## 2022-03-16 VITALS — BP 128/82 | HR 127 | Temp 98.1°F | Wt 203.0 lb

## 2022-03-16 DIAGNOSIS — G4483 Primary cough headache: Secondary | ICD-10-CM

## 2022-03-16 DIAGNOSIS — R0981 Nasal congestion: Secondary | ICD-10-CM | POA: Diagnosis not present

## 2022-03-16 DIAGNOSIS — Z1152 Encounter for screening for COVID-19: Secondary | ICD-10-CM

## 2022-03-16 DIAGNOSIS — R051 Acute cough: Secondary | ICD-10-CM | POA: Diagnosis not present

## 2022-03-16 DIAGNOSIS — J0110 Acute frontal sinusitis, unspecified: Secondary | ICD-10-CM

## 2022-03-16 DIAGNOSIS — J029 Acute pharyngitis, unspecified: Secondary | ICD-10-CM

## 2022-03-16 LAB — POC COVID19 BINAXNOW: SARS Coronavirus 2 Ag: NEGATIVE

## 2022-03-16 MED ORDER — PROMETHAZINE-DM 6.25-15 MG/5ML PO SYRP: 5.0000 mL | ORAL_SOLUTION | Freq: Four times a day (QID) | ORAL | 0 refills | Status: DC | PRN

## 2022-03-16 MED ORDER — DEXAMETHASONE SODIUM PHOSPHATE 10 MG/ML IJ SOLN
10.0000 mg | Freq: Once | INTRAMUSCULAR | Status: AC
  Administered 2022-03-16: 10 mg via INTRAMUSCULAR

## 2022-03-16 MED ORDER — AZITHROMYCIN 250 MG PO TABS: ORAL_TABLET | ORAL | 1 refills | Status: DC

## 2022-03-16 NOTE — Progress Notes (Signed)
Assessment and Plan:  Kelsey Wheeler was seen today for an episodic visit.  Diagnoses and all order for this visit:  1. Acute non-recurrent frontal sinusitis Stay well hydrated to keep mucus thin and productive. OTC antihistamine PRN Continue Mucinex PRN as directed.  - azithromycin (ZITHROMAX) 250 MG tablet; Take 2 tablets (500 mg) on Day 1 followed by 1 tablet  (250 mg) daily until complete.  Dispense: 6 each; Refill: 1  2. Encounter for screening for COVID-19 Negative  - POC COVID-19  3. Acute cough Stay well hydrated.  - dexamethasone (DECADRON) injection 10 mg - promethazine-dextromethorphan (PROMETHAZINE-DM) 6.25-15 MG/5ML syrup; Take 5 mLs by mouth 4 (four) times daily as needed for cough.  Dispense: 240 mL; Refill: 0  4. Nasal congestion Mucinex Hydration  - dexamethasone (DECADRON) injection 10 mg  5. Primary cough headache Tylenol OTC as directed for HA Push fluids  - dexamethasone (DECADRON) injection 10 mg  6. Sore throat Warm salt water gargles several times throughout the day. Continue to monitor   Notify office for further evaluation and treatment, questions or concerns if s/s fail to improve. The risks and benefits of my recommendations, as well as other treatment options were discussed with the patient today. Questions were answered.  Further disposition pending results of labs. Discussed med's effects and SE's.    Over 15 minutes of exam, counseling, chart review, and critical decision making was performed.   Future Appointments  Date Time Provider Department Center  08/19/2022  9:30 AM Raynelle Dick, NP [facility] None  02/18/2023 10:00 AM Raynelle Dick, NP [facility] None    ------------------------------------------------------------------------------------------------------------------   HPI BP 128/82   Pulse (!) 127   Temp 98.1 F (36.7 C)   Wt 203 lb (92.1 kg)   SpO2 98%   BMI 34.84 kg/m    Patient complains of symptoms of a  URI, possible sinusitis. Symptoms include congestion, headache described as throbbing, nasal congestion, non productive cough, sinus pressure, sneezing, and sore throat. Onset of symptoms was 1 week ago, and has been unchanged since that time. Treatment to date: antihistamines and decongestants.  Daughter recently started Daycare and both of her children have been sick.  They were seen by Peds and negative for Strep.  Patient is negative for covid today.  She continues to have cough that keeps her awake at night.     Past Medical History:  Diagnosis Date   ADD (attention deficit disorder) 03/03/2014   GERD (gastroesophageal reflux disease)    Gestational diabetes    Hx of thyroiditis      No Known Allergies  Current Outpatient Medications on File Prior to Visit  Medication Sig   norethindrone-ethinyl estradiol (JUNEL 1/20) 1-20 MG-MCG tablet Take 1 tablet by mouth daily.   sertraline (ZOLOFT) 25 MG tablet Take 1 tablet (25 mg total) by mouth daily.   ibuprofen (ADVIL) 600 MG tablet Take 1 tablet (600 mg total) by mouth every 6 (six) hours.   Semaglutide-Weight Management (WEGOVY) 0.25 MG/0.5ML SOAJ Inject 0.25 mg into the skin once a week. (Patient not taking: Reported on 03/16/2022)   No current facility-administered medications on file prior to visit.    ROS: all negative except what is noted in the HPI.   Physical Exam:  BP 128/82   Pulse (!) 127   Temp 98.1 F (36.7 C)   Wt 203 lb (92.1 kg)   SpO2 98%   BMI 34.84 kg/m   General Appearance: NAD.  Awake, conversant and cooperative.  Eyes: PERRLA, EOMs intact.  Sclera white.  Conjunctiva without erythema. Sinuses: Frontal/maxillary tenderness.  No nasal discharge. Nares patent.  ENT/Mouth: Ext aud canals clear.  Bilateral TMs w/DOL and without erythema or bulging. Hearing intact.  Posterior pharynx without swelling or exudate.  Tonsils without swelling or erythema.  Neck: Supple.  No masses, nodules or  thyromegaly. Respiratory: Effort is regular with non-labored breathing. Breath sounds are equal bilaterally without rales, rhonchi, wheezing or stridor.  Cardio: RRR with no MRGs. Brisk peripheral pulses without edema.  Abdomen: Active BS in all four quadrants.  Soft and non-tender without guarding, rebound tenderness, hernias or masses. Lymphatics: Non tender without lymphadenopathy.  Musculoskeletal: Full ROM, 5/5 strength, normal ambulation.  No clubbing or cyanosis. Skin: Appropriate color for ethnicity. Warm without rashes, lesions, ecchymosis, ulcers.  Neuro: CN II-XII grossly normal. Normal muscle tone without cerebellar symptoms and intact sensation.   Psych: AO X 3,  appropriate mood and affect, insight and judgment.     Darrol Jump, NP 2:05 PM [facility]

## 2022-03-16 NOTE — Patient Instructions (Signed)

## 2022-03-23 ENCOUNTER — Encounter: Payer: Self-pay | Admitting: Nurse Practitioner

## 2022-03-24 ENCOUNTER — Other Ambulatory Visit (HOSPITAL_COMMUNITY): Payer: Self-pay

## 2022-03-30 ENCOUNTER — Encounter: Payer: Self-pay | Admitting: Nurse Practitioner

## 2022-03-31 ENCOUNTER — Other Ambulatory Visit: Payer: Self-pay | Admitting: Nurse Practitioner

## 2022-03-31 ENCOUNTER — Other Ambulatory Visit (HOSPITAL_COMMUNITY): Payer: Self-pay

## 2022-03-31 DIAGNOSIS — Z6833 Body mass index (BMI) 33.0-33.9, adult: Secondary | ICD-10-CM

## 2022-03-31 MED ORDER — WEGOVY 1 MG/0.5ML ~~LOC~~ SOAJ
1.0000 mg | SUBCUTANEOUS | 3 refills | Status: DC
  Filled 2022-03-31: qty 2, 28d supply, fill #0

## 2022-04-01 ENCOUNTER — Other Ambulatory Visit: Payer: Self-pay | Admitting: Nurse Practitioner

## 2022-04-01 DIAGNOSIS — Z6833 Body mass index (BMI) 33.0-33.9, adult: Secondary | ICD-10-CM

## 2022-04-01 MED ORDER — PHENTERMINE HCL 37.5 MG PO TABS: ORAL_TABLET | ORAL | 0 refills | Status: DC

## 2022-04-01 MED ORDER — PHENTERMINE HCL 37.5 MG PO TABS: ORAL_TABLET | ORAL | 5 refills | Status: DC

## 2022-04-06 ENCOUNTER — Other Ambulatory Visit: Payer: Self-pay | Admitting: Nurse Practitioner

## 2022-04-06 ENCOUNTER — Encounter: Payer: Self-pay | Admitting: Nurse Practitioner

## 2022-04-06 DIAGNOSIS — R051 Acute cough: Secondary | ICD-10-CM

## 2022-04-06 MED ORDER — PROMETHAZINE-DM 6.25-15 MG/5ML PO SYRP: 5.0000 mL | ORAL_SOLUTION | Freq: Four times a day (QID) | ORAL | 1 refills | Status: DC | PRN

## 2022-04-09 ENCOUNTER — Other Ambulatory Visit (HOSPITAL_COMMUNITY): Payer: Self-pay

## 2022-04-14 ENCOUNTER — Other Ambulatory Visit (HOSPITAL_COMMUNITY): Payer: Self-pay

## 2022-04-14 ENCOUNTER — Encounter: Payer: Self-pay | Admitting: Nurse Practitioner

## 2022-04-14 ENCOUNTER — Ambulatory Visit (INDEPENDENT_AMBULATORY_CARE_PROVIDER_SITE_OTHER): Payer: No Typology Code available for payment source | Admitting: Nurse Practitioner

## 2022-04-14 VITALS — BP 138/88 | HR 100 | Temp 97.5°F | Ht 64.0 in | Wt 202.2 lb

## 2022-04-14 DIAGNOSIS — Z6833 Body mass index (BMI) 33.0-33.9, adult: Secondary | ICD-10-CM | POA: Diagnosis not present

## 2022-04-14 DIAGNOSIS — H65111 Acute and subacute allergic otitis media (mucoid) (sanguinous) (serous), right ear: Secondary | ICD-10-CM | POA: Diagnosis not present

## 2022-04-14 DIAGNOSIS — H65191 Other acute nonsuppurative otitis media, right ear: Secondary | ICD-10-CM

## 2022-04-14 MED ORDER — DEXAMETHASONE 2 MG PO TABS: ORAL_TABLET | ORAL | 0 refills | Status: DC

## 2022-04-14 MED ORDER — AMOXICILLIN 500 MG PO TABS: 500.0000 mg | ORAL_TABLET | Freq: Three times a day (TID) | ORAL | 0 refills | Status: DC

## 2022-04-14 NOTE — Progress Notes (Signed)
Assessment and Plan:  Kelsey Wheeler was seen today for otalgia.  Diagnoses and all orders for this visit:  Acute mucoid otitis media of right ear Continue Mucinex and Sudafed as needed Stop Zithromax and begin Amoxicillin 500 mg TID x 10 If symptoms are not resolved in the next 5 days or symptoms worsen notify the office -     amoxicillin (AMOXIL) 500 MG tablet; Take 1 tablet (500 mg total) by mouth 3 (three) times daily. 10 days -     dexamethasone (DECADRON) 2 MG tablet; Take 3 tabs for 3 days, 2 tabs for 3 days 1 tab for 5 days. Take with food.  BMI 33.0-33.9,adult Fair life protein shakes Eat more frequently - try not to go more than 6 hours without protein Aim for 90 grams of protein a day- 30 breakfast/30 lunch 30 dinner Try to keep net carbs less than 50 Net Carbs=Total Carbs-fiber- sugar alcohols Exercise heartrate 120-140(fat burning zone)- walking 20-30 minutes 4 days a week Continue Phentermine      Further disposition pending results of labs. Discussed med's effects and SE's.   Over 30 minutes of exam, counseling, chart review, and critical decision making was performed.   Future Appointments  Date Time Provider [place]  08/19/2022  9:30 AM Alycia Rossetti, NP [facility] None  02/18/2023 10:00 AM Alycia Rossetti, NP [facility] None    ------------------------------------------------------------------------------------------------------------------   HPI BP 138/88   Pulse 100   Temp (!) 97.5 F (36.4 C)   Ht 5\' 4"  (1.626 m)   Wt 202 lb 3.2 oz (91.7 kg)   SpO2 97%   BMI 34.71 kg/m    26 y.o.female presents for yellow nasal discharge with non productive cough, Right ear started to hurt last night.  She has started a Z pak.  Her children are both getting over RSV. She was using Mucinex and Sudafed with little relief. She denies muscle aches, nausea, vomiting and diarrhea     BMI is Body mass index is 34.71 kg/m., she has not been working on diet and  exercise. Has been prescribed phentermine and has been able to take the medication without side effects Wt Readings from Last 3 Encounters:  04/14/22 202 lb 3.2 oz (91.7 kg)  03/16/22 203 lb (92.1 kg)  02/17/22 199 lb 3.2 oz (90.4 kg)     Past Medical History:  Diagnosis Date   ADD (attention deficit disorder) 03/03/2014   GERD (gastroesophageal reflux disease)    Gestational diabetes    Hx of thyroiditis      No Known Allergies  Current Outpatient Medications on File Prior to Visit  Medication Sig   Ascorbic Acid (VITAMIN C) 500 MG CHEW Chew by mouth.   azithromycin (ZITHROMAX) 250 MG tablet Take 2 tablets (500 mg) on Day 1 followed by 1 tablet  (250 mg) daily until complete.   ibuprofen (ADVIL) 600 MG tablet Take 1 tablet (600 mg total) by mouth every 6 (six) hours.   phentermine (ADIPEX-P) 37.5 MG tablet Take 1/2 to 1 tablet every morning for dieting & weightloss   norethindrone-ethinyl estradiol (JUNEL 1/20) 1-20 MG-MCG tablet Take 1 tablet by mouth daily.   promethazine-dextromethorphan (PROMETHAZINE-DM) 6.25-15 MG/5ML syrup Take 5 mLs by mouth 4 (four) times daily as needed for cough. (Patient not taking: Reported on 04/14/2022)   Semaglutide-Weight Management (WEGOVY) 1 MG/0.5ML SOAJ Inject 1 mg into the skin once a week. (Patient not taking: Reported on 04/14/2022)   sertraline (ZOLOFT) 25 MG tablet Take 1  tablet (25 mg total) by mouth daily. (Patient not taking: Reported on 04/14/2022)   No current facility-administered medications on file prior to visit.    ROS: all negative except above.   Physical Exam:  BP 138/88   Pulse 100   Temp (!) 97.5 F (36.4 C)   Ht 5\' 4"  (1.626 m)   Wt 202 lb 3.2 oz (91.7 kg)   SpO2 97%   BMI 34.71 kg/m   General Appearance: Well nourished, in no apparent distress. Eyes: PERRLA, EOMs, conjunctiva no swelling or erythema Sinuses: No Frontal/maxillary tenderness ENT/Mouth: Ext aud canals clear, Right TM  erythema, bulging and mucus  noted. L TM no bulging or erythema. No erythema, swelling, or exudate on post pharynx.  Tonsils not swollen or erythematous. Hearing normal.  Neck: Supple, thyroid normal.  Respiratory: Respiratory effort normal, BS equal bilaterally without rales, rhonchi, wheezing or stridor.  Cardio: RRR with no MRGs. Brisk peripheral pulses without edema.  Abdomen: Soft, + BS.  Non tender, no guarding, rebound, hernias, masses. Lymphatics: Non tender without lymphadenopathy.  Musculoskeletal: Full ROM, 5/5 strength, normal gait.  Skin: Warm, dry without rashes, lesions, ecchymosis.  Neuro: Cranial nerves intact. Normal muscle tone, no cerebellar symptoms. Sensation intact.  Psych: Awake and oriented X 3, normal affect, Insight and Judgment appropriate.     , NP 1:52 PM [facility]

## 2022-04-20 ENCOUNTER — Encounter: Payer: Self-pay | Admitting: Nurse Practitioner

## 2022-06-03 NOTE — Progress Notes (Deleted)
Assessment and Plan:  There are no diagnoses linked to this encounter.    Further disposition pending results of labs. Discussed med's effects and SE's.   Over 30 minutes of exam, counseling, chart review, and critical decision making was performed.   Future Appointments  Date Time Provider Department Center  06/09/2022  4:00 PM Raynelle Dick, NP [facility] None  08/19/2022  9:30 AM Raynelle Dick, NP [facility] None  02/18/2023 10:00 AM Raynelle Dick, NP [facility] None    ------------------------------------------------------------------------------------------------------------------   HPI There were no vitals taken for this visit. 26 y.o.female presents for  Past Medical History:  Diagnosis Date   ADD (attention deficit disorder) 03/03/2014   GERD (gastroesophageal reflux disease)    Gestational diabetes    Hx of thyroiditis      No Known Allergies  Current Outpatient Medications on File Prior to Visit  Medication Sig   amoxicillin (AMOXIL) 500 MG tablet Take 1 tablet (500 mg total) by mouth 3 (three) times daily. 10 days   Ascorbic Acid (VITAMIN C) 500 MG CHEW Chew by mouth.   azithromycin (ZITHROMAX) 250 MG tablet Take 2 tablets (500 mg) on Day 1 followed by 1 tablet  (250 mg) daily until complete.   dexamethasone (DECADRON) 2 MG tablet Take 3 tabs for 3 days, 2 tabs for 3 days 1 tab for 5 days. Take with food.   ibuprofen (ADVIL) 600 MG tablet Take 1 tablet (600 mg total) by mouth every 6 (six) hours.   phentermine (ADIPEX-P) 37.5 MG tablet Take 1/2 to 1 tablet every morning for dieting & weightloss   No current facility-administered medications on file prior to visit.    ROS: all negative except above.   Physical Exam:  There were no vitals taken for this visit.  General Appearance: Well nourished, in no apparent distress. Eyes: PERRLA, EOMs, conjunctiva no swelling or erythema Sinuses: No Frontal/maxillary tenderness ENT/Mouth: Ext aud canals  clear, TMs without erythema, bulging. No erythema, swelling, or exudate on post pharynx.  Tonsils not swollen or erythematous. Hearing normal.  Neck: Supple, thyroid normal.  Respiratory: Respiratory effort normal, BS equal bilaterally without rales, rhonchi, wheezing or stridor.  Cardio: RRR with no MRGs. Brisk peripheral pulses without edema.  Abdomen: Soft, + BS.  Non tender, no guarding, rebound, hernias, masses. Lymphatics: Non tender without lymphadenopathy.  Musculoskeletal: Full ROM, 5/5 strength, normal gait.  Skin: Warm, dry without rashes, lesions, ecchymosis.  Neuro: Cranial nerves intact. Normal muscle tone, no cerebellar symptoms. Sensation intact.  Psych: Awake and oriented X 3, normal affect, Insight and Judgment appropriate.     Raynelle Dick, NP 12:42 PM [facility]

## 2022-06-09 ENCOUNTER — Ambulatory Visit: Payer: No Typology Code available for payment source | Admitting: Nurse Practitioner

## 2022-06-22 DIAGNOSIS — H52223 Regular astigmatism, bilateral: Secondary | ICD-10-CM | POA: Diagnosis not present

## 2022-06-22 DIAGNOSIS — H18892 Other specified disorders of cornea, left eye: Secondary | ICD-10-CM | POA: Diagnosis not present

## 2022-06-22 DIAGNOSIS — H5213 Myopia, bilateral: Secondary | ICD-10-CM | POA: Diagnosis not present

## 2022-07-06 ENCOUNTER — Encounter: Payer: Self-pay | Admitting: Nurse Practitioner

## 2022-07-06 NOTE — Telephone Encounter (Signed)
Please check with the [facility] rep about the mail order pharmacy and if the have the low doses available

## 2022-07-08 ENCOUNTER — Other Ambulatory Visit: Payer: Self-pay

## 2022-07-08 ENCOUNTER — Ambulatory Visit (INDEPENDENT_AMBULATORY_CARE_PROVIDER_SITE_OTHER): Payer: 59 | Admitting: Nurse Practitioner

## 2022-07-08 ENCOUNTER — Encounter: Payer: Self-pay | Admitting: Nurse Practitioner

## 2022-07-08 VITALS — HR 106 | Temp 97.3°F

## 2022-07-08 DIAGNOSIS — R6889 Other general symptoms and signs: Secondary | ICD-10-CM | POA: Diagnosis not present

## 2022-07-08 DIAGNOSIS — Z1152 Encounter for screening for COVID-19: Secondary | ICD-10-CM | POA: Diagnosis not present

## 2022-07-08 LAB — POCT INFLUENZA A/B
Influenza A, POC: NEGATIVE
Influenza B, POC: NEGATIVE

## 2022-07-08 LAB — POC COVID19 BINAXNOW: SARS Coronavirus 2 Ag: POSITIVE — AB

## 2022-07-08 NOTE — Progress Notes (Signed)
THIS ENCOUNTER IS A VIRTUAL VISIT DUE TO COVID-19 - PATIENT WAS NOT SEEN IN THE OFFICE.  PATIENT HAS CONSENTED TO VIRTUAL VISIT / TELEMEDICINE VISIT   Virtual Visit via telephone Note  I connected with  Zenaida Deed on 07/08/2022 by telephone.  I verified that I am speaking with the correct person using two identifiers.    I discussed the limitations of evaluation and management by telemedicine and the availability of in person appointments. The patient expressed understanding and agreed to proceed.  History of Present Illness:  Pulse (!) 106   Temp (!) 97.3 F (36.3 C)   SpO2 98%   27 y.o. patient contacted office reporting URI sx nasal congestion. she tested positive by rapid test in office. OV was conducted by telephone to minimize exposure. This patient has been  vaccinated for covid 19, last 11/20201  Sx began today days with nasal congestion.  Treatments tried so far: Currently on doxycycline.    Exposures: Children who are positive   Medications  Current Outpatient Medications (Endocrine & Metabolic):    dexamethasone (DECADRON) 2 MG tablet, Take 3 tabs for 3 days, 2 tabs for 3 days 1 tab for 5 days. Take with food.    Current Outpatient Medications (Analgesics):    ibuprofen (ADVIL) 600 MG tablet, Take 1 tablet (600 mg total) by mouth every 6 (six) hours.   Current Outpatient Medications (Other):    amoxicillin (AMOXIL) 500 MG tablet, Take 1 tablet (500 mg total) by mouth 3 (three) times daily. 10 days   Ascorbic Acid (VITAMIN C) 500 MG CHEW, Chew by mouth.   azithromycin (ZITHROMAX) 250 MG tablet, Take 2 tablets (500 mg) on Day 1 followed by 1 tablet  (250 mg) daily until complete.   phentermine (ADIPEX-P) 37.5 MG tablet, Take 1/2 to 1 tablet every morning for dieting & weightloss  Allergies: No Known Allergies  Problem list She has Hx of thyroiditis; ADD (attention deficit disorder); Overweight (BMI 25.0-29.9); Vitamin D deficiency; Encounter for induction of  labor; SVD (spontaneous vaginal delivery); Postpartum care following vaginal delivery 3/22; Perineal laceration, second degree; Gestational diabetes mellitus (GDM), antepartum; and Rh negative, maternal / newborn Rh negative on their problem list.   Social History:   reports that she has never smoked. She has never used smokeless tobacco. She reports that she does not currently use alcohol. She reports that she does not use drugs.  Observations/Objective:  General : Well sounding patient in no apparent distress HEENT: no hoarseness, no cough for duration of visit Lungs: speaks in complete sentences, no audible wheezing, no apparent distress Neurological: alert, oriented x 3 Psychiatric: pleasant, judgement appropriate   Assessment and Plan:  Covid 19 Covid 19 positive per rapid screening test in office Risk factors include: overweight Symptoms are: mild Due to co morbid conditions and risk factors, discussed antivirals  Immue support reviewed Vitamin C, D, Zinc Take tylenol PRN temp 101+ Push hydration Regular ambulation or calf exercises exercises for clot prevention and 81 mg ASA unless contraindicated Sx supportive therapy suggested Follow up via mychart or telephone if needed Advised patient obtain O2 monitor; present to ED if persistently <90% or with severe dyspnea, CP, fever uncontrolled by tylenol, confusion, sudden decline       Should remain in isolation 5 days from testing positive and then wear a mask when around other people for the following 5 days  Flu-like symptoms Negative  - POCT Influenza A/B  Encounter for screening for COVID-19 Positive   -  POC COVID-19   Follow Up Instructions:  I discussed the assessment and treatment plan with the patient. The patient was provided an opportunity to ask questions and all were answered. The patient agreed with the plan and demonstrated an understanding of the instructions.   The patient was advised to call back or  seek an in-person evaluation if the symptoms worsen or if the condition fails to improve as anticipated.  I provided 15 minutes of non-face-to-face time during this encounter.   Darrol Jump, NP

## 2022-07-10 ENCOUNTER — Encounter: Payer: Self-pay | Admitting: Nurse Practitioner

## 2022-08-10 ENCOUNTER — Encounter: Payer: Self-pay | Admitting: Nurse Practitioner

## 2022-08-19 ENCOUNTER — Ambulatory Visit: Payer: 59 | Admitting: Nurse Practitioner

## 2022-08-26 ENCOUNTER — Encounter: Payer: Self-pay | Admitting: Nurse Practitioner

## 2022-08-26 ENCOUNTER — Ambulatory Visit (INDEPENDENT_AMBULATORY_CARE_PROVIDER_SITE_OTHER): Payer: 59 | Admitting: Nurse Practitioner

## 2022-08-26 ENCOUNTER — Other Ambulatory Visit: Payer: Self-pay

## 2022-08-26 VITALS — BP 110/78 | HR 139 | Temp 97.3°F | Ht 64.0 in | Wt 201.0 lb

## 2022-08-26 DIAGNOSIS — J0110 Acute frontal sinusitis, unspecified: Secondary | ICD-10-CM | POA: Diagnosis not present

## 2022-08-26 DIAGNOSIS — Z1152 Encounter for screening for COVID-19: Secondary | ICD-10-CM

## 2022-08-26 DIAGNOSIS — R6889 Other general symptoms and signs: Secondary | ICD-10-CM

## 2022-08-26 LAB — POCT INFLUENZA A/B
Influenza A, POC: NEGATIVE
Influenza B, POC: NEGATIVE

## 2022-08-26 LAB — POC COVID19 BINAXNOW: SARS Coronavirus 2 Ag: NEGATIVE

## 2022-08-26 MED ORDER — DEXAMETHASONE SODIUM PHOSPHATE 10 MG/ML IJ SOLN
10.0000 mg | Freq: Once | INTRAMUSCULAR | Status: AC
  Administered 2022-08-26: 10 mg via INTRAMUSCULAR

## 2022-08-26 MED ORDER — AZITHROMYCIN 500 MG PO TABS: 500.0000 mg | ORAL_TABLET | Freq: Every day | ORAL | 0 refills | Status: AC

## 2022-08-26 NOTE — Progress Notes (Signed)
Assessment and Plan:  Kelsey Wheeler was seen today for an episodic visit.  Diagnoses and all order for this visit:  Flu-like symptoms Negative  - POCT Influenza A/B  Encounter for screening for COVID-19 Negative  - POC COVID-19  Acute non-recurrent frontal sinusitis Zyrtec 10 mg samples provided - take daily Dexamethazone injection administered - tolerated well Start Azithromycin as directed Stay well hydrated to keep mucus thin and produce May continue Mucinex as directed as needed   - dexamethasone (DECADRON) injection 10 mg - azithromycin (ZITHROMAX) 500 MG tablet; Take 1 tablet (500 mg total) by mouth daily for 5 days.  Dispense: 5 tablet; Refill: 0  Notify office for further evaluation and treatment, questions or concerns if s/s fail to improve. The risks and benefits of my recommendations, as well as other treatment options were discussed with the patient today. Questions were answered.  Further disposition pending results of labs. Discussed med's effects and SE's.    Over 15 minutes of exam, counseling, chart review, and critical decision making was performed.   Future Appointments  Date Time Provider [place]  08/31/2022  9:45 AM Alycia Rossetti, NP [facility] None  02/18/2023 10:00 AM Alycia Rossetti, NP [facility] None    ------------------------------------------------------------------------------------------------------------------   HPI BP 110/78   Pulse (!) 139   Temp (!) 97.3 F (36.3 C)   Ht '5\' 4"'$  (1.626 m)   Wt 201 lb (91.2 kg)   SpO2 98%   Breastfeeding No   BMI 34.50 kg/m    Patient complains of symptoms of a URI, possible sinusitis. Symptoms include congestion, facial pain, headache described as ache and pressure, nasal congestion, non productive cough, post nasal drip, and sinus pressure. Onset of symptoms was has been since 08/10/22 and has been unchanged since that time. Treatment to date: antihistamines and decongestants.  Denies  fever, chills, N/V.  She has two young children and husband that are also sick.  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   Past Medical History:  Diagnosis Date   ADD (attention deficit disorder) 03/03/2014   GERD (gastroesophageal reflux disease)    Gestational diabetes    Hx of thyroiditis      No Known Allergies  Current Outpatient Medications on File Prior to Visit  Medication Sig   sertraline (ZOLOFT) 25 MG tablet Take 25 mg by mouth daily.   Ascorbic Acid (VITAMIN C) 500 MG CHEW Chew by mouth. (Patient not taking: Reported on 08/26/2022)   ibuprofen (ADVIL) 600 MG tablet Take 1 tablet (600 mg total) by mouth every 6 (six) hours. (Patient not taking: Reported on 08/26/2022)   No current facility-administered medications on file prior to visit.    ROS: all negative except what is noted in the HPI.   Physical Exam:  BP 110/78   Pulse (!) 139   Temp (!) 97.3 F (36.3 C)   Ht '5\' 4"'$  (1.626 m)   Wt 201 lb (91.2 kg)   SpO2 98%   Breastfeeding No   BMI 34.50 kg/m   General Appearance: NAD.  Awake, conversant and cooperative. Eyes: PERRLA, EOMs intact.  Sclera white.  Conjunctiva without erythema. Sinuses: Frontal/maxillary tenderness.  No nasal discharge. Nares not patent.  ENT/Mouth: Ext aud canals clear.  Bilateral TMs w/DOL and without erythema or bulging. Hearing intact.  Posterior pharynx without swelling or exudate.  Tonsils without swelling or erythema.  Neck: Supple.  No masses, nodules or thyromegaly. Respiratory: Effort is regular with non-labored breathing. Breath sounds  are equal bilaterally without rales, rhonchi, wheezing or stridor.  Cardio: RRR with no MRGs. Brisk peripheral pulses without edema.  Abdomen: Active BS in all four quadrants.  Soft and non-tender without guarding, rebound tenderness, hernias or masses. Lymphatics: Non tender without lymphadenopathy.  Musculoskeletal: Full ROM, 5/5 strength, normal ambulation.  No clubbing or cyanosis. Skin:  Appropriate color for ethnicity. Warm without rashes, lesions, ecchymosis, ulcers.  Neuro: CN II-XII grossly normal. Normal muscle tone without cerebellar symptoms and intact sensation.   Psych: AO X 3,  appropriate mood and affect, insight and judgment.     Darrol Jump, NP 11:18 AM [facility]

## 2022-08-26 NOTE — Patient Instructions (Signed)

## 2022-08-30 NOTE — Progress Notes (Unsigned)
6 Month Follow UP  Assessment and Plan:  Kelsey Wheeler was seen today for follow-up.  Diagnoses and all orders for this visit:  Attention deficit disorder (ADD) without hyperactivity Currently well controlled with behavior modifications, no medications  Medication management -     CBC with Differential/Platelet -     COMPLETE METABOLIC PANEL WITH GFR -     Magnesium -     TSH -     VITAMIN D 25 Hydroxy (Vit-D Deficiency, Fractures)  Vitamin D deficiency Encouraged use of Vit D supplement -     VITAMIN D 25 Hydroxy (Vit-D Deficiency, Fractures)  Obesity (BMI 30.0-34.9) Fair life protein shakes Eat more frequently - try not to go more than 6 hours without protein Aim for 90 grams of protein a day- 30 breakfast/30 lunch 30 dinner Try to keep net carbs less than 50 Net Carbs=Total Carbs-fiber- sugar alcohols Exercise heartrate 120-140(fat burning zone)- walking 20-30 minutes 4 days a week  -     CBC with Differential/Platelet -     COMPLETE METABOLIC PANEL WITH GFR -     TSH     Discussed med's effects and SE's. Screening labs and tests as requested with regular follow-up as recommended. Over 40 minutes of face to face interview, exam, counseling, chart review and critical decision making was performed  HPI  This very nice 27 y.o.female presents for 6 month follow up for has Hx of thyroiditis; ADD (attention deficit disorder); Overweight (BMI 25.0-29.9); Vitamin D deficiency; Encounter for induction of labor; SVD (spontaneous vaginal delivery); Postpartum care following vaginal delivery 3/22; Perineal laceration, second degree; Gestational diabetes mellitus (GDM), antepartum; and Rh negative, maternal / newborn Rh negative on their problem list.   Kelsey Wheeler is going to start in day surgery in April at [facility]. Mood well controlled on Zoloft 25 mg QD. Currently having no issues with herr ADD- behavior modification is controlling, no meds.   BP is normally well controlled.  BP Readings  from Last 3 Encounters:  08/31/22 (!) 140/82  08/26/22 110/78  04/14/22 138/88     Kelsey Wheeler doesworkout 3-4 times a week  Unable to take Phentermine due to elevated heart rate.  BMI is Body mass index is 34.64 kg/m., Kelsey Wheeler has been working on diet and exercise. Wt Readings from Last 3 Encounters:  08/31/22 201 lb 12.8 oz (91.5 kg)  08/26/22 201 lb (91.2 kg)  04/14/22 202 lb 3.2 oz (91.7 kg)     Finally, patient has history of Vitamin D Deficiency and last vitamin D was  Lab Results  Component Value Date   VD25OH 28 (L) 02/17/2022     Current Medications:  Current Outpatient Medications on File Prior to Visit  Medication Sig Dispense Refill   azithromycin (ZITHROMAX) 500 MG tablet Take 1 tablet (500 mg total) by mouth daily for 5 days. 5 tablet 0   ibuprofen (ADVIL) 600 MG tablet Take 1 tablet (600 mg total) by mouth every 6 (six) hours. 30 tablet 0   sertraline (ZOLOFT) 25 MG tablet Take 25 mg by mouth daily.     Ascorbic Acid (VITAMIN C) 500 MG CHEW Chew by mouth. (Patient not taking: Reported on 08/26/2022)     No current facility-administered medications on file prior to visit.   Health Maintenance:   Immunization History  Administered Date(s) Administered   HPV Quadrivalent 03/11/2008, 05/17/2008, 09/18/2008   Hepatitis B 12/06/2011   Influenza Inj Mdck Quad With Preservative 03/15/2017, 02/23/2018, 04/03/2019   Influenza Split 03/29/2013, 03/12/2014  Influenza,inj,quad, With Preservative 03/16/2016   PFIZER(Purple Top)SARS-COV-2 Vaccination 04/01/2020, 05/06/2020   PPD Test 11/15/2014, 11/05/2015, 12/17/2016   Tdap 09/07/2011    TD/TDAP: 2013 Influenza: 2021 Pneumovax: N/A Prevnar 13: N/A HPV vaccines: Complete 2010  LMP: 11/15/2020 Sexually Active: No, STD testing offered, deferred. Pap: 07/2018 normal with negative GC/C MGM: N/A  Pregnancy Screenings: HIV screening - Non-reactive RPR: Non-reactive Hep B : Negative Rubella: Immune   Allergies: No Known  Allergies Medical History:  has Hx of thyroiditis; ADD (attention deficit disorder); Overweight (BMI 25.0-29.9); Vitamin D deficiency; Encounter for induction of labor; SVD (spontaneous vaginal delivery); Postpartum care following vaginal delivery 3/22; Perineal laceration, second degree; Gestational diabetes mellitus (GDM), antepartum; and Rh negative, maternal / newborn Rh negative on their problem list. Surgical History:  Kelsey Wheeler  has a past surgical history that includes Wisdom tooth extraction. Family History:  Kelsey Wheeler family history includes Atrial fibrillation in Kelsey Wheeler paternal grandmother; Breast cancer in Kelsey Wheeler maternal grandmother; Diabetes in Kelsey Wheeler paternal grandfather and paternal grandmother; Heart disease in Kelsey Wheeler paternal grandfather; Heart failure in Kelsey Wheeler paternal grandfather; Hyperlipidemia in Kelsey Wheeler father, maternal grandmother, mother, and paternal grandfather; Hypertension in Kelsey Wheeler maternal grandmother; Lung cancer in Kelsey Wheeler maternal grandfather and maternal grandmother. Social History:   reports that Kelsey Wheeler has never smoked. Kelsey Wheeler has never used smokeless tobacco. Kelsey Wheeler reports that Kelsey Wheeler does not currently use alcohol. Kelsey Wheeler reports that Kelsey Wheeler does not use drugs.  Review of Systems: Review of Systems  Constitutional:  Negative for chills, diaphoresis, fever, malaise/fatigue and weight loss.  HENT:  Negative for congestion, ear discharge, ear pain, hearing loss, nosebleeds, sinus pain, sore throat and tinnitus.   Eyes:  Negative for blurred vision, double vision, photophobia, pain, discharge and redness.  Respiratory:  Negative for cough, hemoptysis, sputum production, shortness of breath, wheezing and stridor.   Cardiovascular:  Negative for chest pain, palpitations, orthopnea, claudication, leg swelling and PND.  Gastrointestinal:  Negative for abdominal pain, blood in stool, constipation, diarrhea, heartburn, melena, nausea and vomiting.  Genitourinary:  Negative for dysuria, flank pain, frequency, hematuria and  urgency.  Musculoskeletal:  Negative for back pain, falls, joint pain, myalgias and neck pain.  Skin:  Negative for itching and rash.  Neurological:  Negative for dizziness, tingling, tremors, sensory change, speech change, focal weakness, seizures, loss of consciousness, weakness and headaches.  Endo/Heme/Allergies:  Negative for environmental allergies and polydipsia. Does not bruise/bleed easily.  Psychiatric/Behavioral:  Negative for depression, hallucinations, memory loss, substance abuse and suicidal ideas. The patient is not nervous/anxious and does not have insomnia.     Physical Exam: Estimated body mass index is 34.64 kg/m as calculated from the following:   Height as of this encounter: 5\' 4"  (1.626 m).   Weight as of this encounter: 201 lb 12.8 oz (91.5 kg). BP (!) 140/82   Pulse (!) 114   Temp (!) 97.5 F (36.4 C)   Ht 5\' 4"  (1.626 m)   Wt 201 lb 12.8 oz (91.5 kg)   LMP 08/29/2022   SpO2 97%   BMI 34.64 kg/m  General Appearance: Well nourished, in no apparent distress.  Eyes: PERRLA, EOMs, conjunctiva no swelling or erythema, normal fundi and vessels.  Sinuses: No Frontal/maxillary tenderness  ENT/Mouth: Ext aud canals clear, normal light reflex with TMs without erythema, bulging. Good dentition. No erythema, swelling, or exudate on post pharynx. Tonsils not swollen or erythematous. Hearing normal.  Neck: Supple, thyroid normal. No bruits  Respiratory: Respiratory effort normal, BS equal bilaterally without rales, rhonchi, wheezing or  stridor.  Cardio: RRR without murmurs, rubs or gallops. Brisk peripheral pulses without edema.  Chest: symmetric, with normal excursions and percussion.  Abdomen: nontender, no guarding, rebound, hernias, masses, or UTA organomegaly related to pregnancy. Lymphatics: Non tender without lymphadenopathy.  Musculoskeletal: Full ROM all peripheral extremities,5/5 strength, and normal gait.  Skin: Warm, dry without rashes, lesions,  ecchymosis. Neuro: Cranial nerves intact, reflexes equal bilaterally. Normal muscle tone, no cerebellar symptoms. Sensation intact.  Psych: Awake and oriented X 3, normal affect, Insight and Judgment appropriate.     Alycia Rossetti, NP 9:30 AM [facility]

## 2022-08-31 ENCOUNTER — Encounter: Payer: Self-pay | Admitting: Nurse Practitioner

## 2022-08-31 ENCOUNTER — Ambulatory Visit (INDEPENDENT_AMBULATORY_CARE_PROVIDER_SITE_OTHER): Payer: 59 | Admitting: Nurse Practitioner

## 2022-08-31 VITALS — BP 140/82 | HR 114 | Temp 97.5°F | Ht 64.0 in | Wt 201.8 lb

## 2022-08-31 DIAGNOSIS — F988 Other specified behavioral and emotional disorders with onset usually occurring in childhood and adolescence: Secondary | ICD-10-CM

## 2022-08-31 DIAGNOSIS — Z79899 Other long term (current) drug therapy: Secondary | ICD-10-CM

## 2022-08-31 DIAGNOSIS — E559 Vitamin D deficiency, unspecified: Secondary | ICD-10-CM

## 2022-08-31 DIAGNOSIS — E669 Obesity, unspecified: Secondary | ICD-10-CM | POA: Diagnosis not present

## 2022-08-31 NOTE — Patient Instructions (Signed)
Fair life protein shakes Eat more frequently - try not to go more than 6 hours without protein Aim for 90 grams of protein a day- 30 breakfast/30 lunch 30 dinner Try to keep net carbs less than 50 Net Carbs=Total Carbs-fiber- sugar alcohols Exercise heartrate 120-140(fat burning zone)- walking 20-30 minutes 4 days a week  

## 2022-09-01 ENCOUNTER — Encounter: Payer: Self-pay | Admitting: Nurse Practitioner

## 2022-09-01 LAB — CBC WITH DIFFERENTIAL/PLATELET
Absolute Monocytes: 525 cells/uL (ref 200–950)
Basophils Absolute: 61 cells/uL (ref 0–200)
Basophils Relative: 0.5 %
Eosinophils Absolute: 171 cells/uL (ref 15–500)
Eosinophils Relative: 1.4 %
HCT: 47 % — ABNORMAL HIGH (ref 35.0–45.0)
Hemoglobin: 16 g/dL — ABNORMAL HIGH (ref 11.7–15.5)
Lymphs Abs: 2830 cells/uL (ref 850–3900)
MCH: 29.5 pg (ref 27.0–33.0)
MCHC: 34 g/dL (ref 32.0–36.0)
MCV: 86.6 fL (ref 80.0–100.0)
MPV: 11 fL (ref 7.5–12.5)
Monocytes Relative: 4.3 %
Neutro Abs: 8613 cells/uL — ABNORMAL HIGH (ref 1500–7800)
Neutrophils Relative %: 70.6 %
Platelets: 308 10*3/uL (ref 140–400)
RBC: 5.43 10*6/uL — ABNORMAL HIGH (ref 3.80–5.10)
RDW: 12.9 % (ref 11.0–15.0)
Total Lymphocyte: 23.2 %
WBC: 12.2 10*3/uL — ABNORMAL HIGH (ref 3.8–10.8)

## 2022-09-01 LAB — COMPLETE METABOLIC PANEL WITH GFR
AG Ratio: 1.4 (calc) (ref 1.0–2.5)
ALT: 53 U/L — ABNORMAL HIGH (ref 6–29)
AST: 19 U/L (ref 10–30)
Albumin: 4.2 g/dL (ref 3.6–5.1)
Alkaline phosphatase (APISO): 89 U/L (ref 31–125)
BUN: 14 mg/dL (ref 7–25)
CO2: 23 mmol/L (ref 20–32)
Calcium: 9.4 mg/dL (ref 8.6–10.2)
Chloride: 104 mmol/L (ref 98–110)
Creat: 0.76 mg/dL (ref 0.50–0.96)
Globulin: 2.9 g/dL (calc) (ref 1.9–3.7)
Glucose, Bld: 138 mg/dL — ABNORMAL HIGH (ref 65–99)
Potassium: 4.2 mmol/L (ref 3.5–5.3)
Sodium: 141 mmol/L (ref 135–146)
Total Bilirubin: 0.4 mg/dL (ref 0.2–1.2)
Total Protein: 7.1 g/dL (ref 6.1–8.1)
eGFR: 109 mL/min/{1.73_m2} (ref >=60)

## 2022-09-01 LAB — VITAMIN D 25 HYDROXY (VIT D DEFICIENCY, FRACTURES): Vit D, 25-Hydroxy: 31 ng/mL (ref 30–100)

## 2022-09-01 LAB — TSH: TSH: 0.72 mIU/L

## 2022-09-01 LAB — MAGNESIUM: Magnesium: 1.8 mg/dL (ref 1.5–2.5)

## 2022-11-29 ENCOUNTER — Other Ambulatory Visit (HOSPITAL_COMMUNITY): Payer: Self-pay

## 2022-11-29 ENCOUNTER — Other Ambulatory Visit: Payer: Self-pay | Admitting: Internal Medicine

## 2022-11-29 ENCOUNTER — Other Ambulatory Visit: Payer: Self-pay

## 2022-11-29 MED ORDER — SERTRALINE HCL 25 MG PO TABS
25.0000 mg | ORAL_TABLET | Freq: Every day | ORAL | 1 refills | Status: DC
  Filled 2022-11-29: qty 90, 90d supply, fill #0
  Filled 2023-06-27: qty 90, 90d supply, fill #1

## 2022-11-30 ENCOUNTER — Other Ambulatory Visit (HOSPITAL_COMMUNITY): Payer: Self-pay

## 2022-11-30 MED ORDER — SERTRALINE HCL 25 MG PO TABS
25.0000 mg | ORAL_TABLET | Freq: Every day | ORAL | 0 refills | Status: DC
  Filled 2022-11-30: qty 90, 90d supply, fill #0

## 2022-12-14 DIAGNOSIS — Z3009 Encounter for other general counseling and advice on contraception: Secondary | ICD-10-CM | POA: Diagnosis not present

## 2023-02-16 NOTE — Progress Notes (Signed)
Complete Physical  Assessment and Plan: Kelsey Wheeler was seen today for annual exam.  Diagnoses and all orders for this visit:  Encounter for general adult medical examination with abnormal findings Due Yearly Follows with GYN for PAP  Attention deficit disorder (ADD) without hyperactivity Controlled currently without medication  Obesity (BMI 30.0-34.9) Long discussion about weight loss, diet, and exercise Recommended diet heavy in fruits and veggies and low in animal meats, cheeses, and dairy products, appropriate calorie intake Patient will work on decreasing saturated fats and simple carbs.  Increase lean protein and exercise Will try diethylproprion 25 mg daily for appetite suprression Follow up at next visit  Vitamin D deficiency Continue Vit D supplementation to maintain value in therapeutic level of 60-100  -     VITAMIN D 25 Hydroxy (Vit-D Deficiency, Fractures)  Medication management -     CBC with Differential/Platelet -     COMPLETE METABOLIC PANEL WITH GFR -     Magnesium -     Lipid panel -     TSH -     VITAMIN D 25 Hydroxy (Vit-D Deficiency, Fractures) -     Urinalysis, Routine w reflex microscopic -     Hemoglobin A1C w/out eAG  Molluscum contagiosum of legs Will schedule an appointment for removal  Screening for thyroid disorder -     TSH  Screening for blood or protein in urine -     Urinalysis, Routine w reflex microscopic  Hypertriglyceridemia Work on decreasing saturated fats and simple carbs.  Increase lean protein and exercise -     Lipid panel  Abnormal glucose Decrease simple carbs. Increase exercise and focus on weight loss -     COMPLETE METABOLIC PANEL WITH GFR -     Hemoglobin A1C w/out eAG  History of anemia -     CBC with Differential/Platelet  Depression with anxiety Strongly encouraged to take Zoloft daily Offered to increase dose to 50 mg every day but does not wish to increase dose, will take Zoloft 25 mg 1 1/2 tab  daily  Palpitations -     EKG 12-Lead     Discussed med's effects and SE's. Screening labs and tests as requested with regular follow-up as recommended. Over 40 minutes of face to face interview, exam, counseling, chart review and critical decision making was performed  HPI  This very nice 27 y.o.female presents for complete physical.   She follows with [facility], Dr Billy Coast.  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   She has history of anemia and vitamin D deficiency and ADD and not taking any medication for this.  Reports she mainly utilized this while she was in school.  She is currently on Zoloft 25 mg but has not been taking consistently and was using for depression /anxiety. She will take 1 1/2 pill daily. Does notice a difference when she takes consistently  2 days ago she did have an episode of chest pain but was associated with burping and did resolve with use of Pepcid.  She has been noticing occasional palpitations.   BMI is Body mass index is 34.23 kg/m., she has been working on diet and exercise. She tried Phentermine in the past but gave her tachycardia in the 180's. Wt Readings from Last 3 Encounters:  02/18/23 199 lb 6.4 oz (90.4 kg)  08/31/22 201 lb 12.8 oz (91.5 kg)  08/26/22 201 lb (91.2 kg)    Bps are currently well controlled without medication.  Denies headaches,  chest pain , shortness of breath and dizziness.  BP Readings from Last 3 Encounters:  02/18/23 (!) 120/90  08/31/22 (!) 140/82  08/26/22 110/78   She does have a history of elevated triglycerides and has been work ing on diet and exercise Lab Results  Component Value Date   CHOL 181 02/17/2022   HDL 52 02/17/2022   LDLCALC 99 02/17/2022   TRIG 204 (H) 02/17/2022   CHOLHDL 3.5 02/17/2022    She has a history of abnormal glucose and is working on diet and exercise Lab Results  Component Value Date   HGBA1C 5.2 02/17/2022    Lab Results  Component Value Date   EGFR 109  08/31/2022    Finally, patient has history of Vitamin D Deficiency and last vitamin D was  Lab Results  Component Value Date   VD25OH 31 08/31/2022     Current Medications:  Current Outpatient Medications on File Prior to Visit  Medication Sig Dispense Refill   Cholecalciferol (VITAMIN D) 50 MCG (2000 UT) CAPS Take by mouth.     COLLAGEN PO Take by mouth.     ibuprofen (ADVIL) 600 MG tablet Take 1 tablet (600 mg total) by mouth every 6 (six) hours. 30 tablet 0   Multiple Vitamins-Minerals (HAIR SKIN AND NAILS FORMULA PO) Take by mouth.     sertraline (ZOLOFT) 25 MG tablet Take 1 tablet (25 mg total) by mouth daily. 90 tablet 1   sertraline (ZOLOFT) 25 MG tablet Take 1 tablet (25 mg total) by mouth daily. 90 tablet 0   Ascorbic Acid (VITAMIN C) 500 MG CHEW Chew by mouth. (Patient not taking: Reported on 08/26/2022)     No current facility-administered medications on file prior to visit.   Health Maintenance:   Immunization History  Administered Date(s) Administered   HPV Quadrivalent 03/11/2008, 05/17/2008, 09/18/2008   Hepatitis B 12/06/2011   Influenza Inj Mdck Quad With Preservative 03/15/2017, 02/23/2018, 04/03/2019   Influenza Split 03/29/2013, 03/12/2014   Influenza,inj,quad, With Preservative 03/16/2016   PFIZER(Purple Top)SARS-COV-2 Vaccination 04/01/2020, 05/06/2020   PPD Test 11/15/2014, 11/05/2015, 12/17/2016   Tdap 09/07/2011   Health Maintenance  Topic Date Due   Hepatitis C Screening  Never done   COVID-19 Vaccine (3 - Pfizer risk series) 06/03/2020   PAP-Cervical Cytology Screening  08/02/2020   PAP SMEAR-Modifier  08/02/2020   DTaP/Tdap/Td (2 - Td or Tdap) 09/06/2021   INFLUENZA VACCINE  01/13/2023   HPV VACCINES  Completed   HIV Screening  Completed     Allergies: No Known Allergies Medical History:  has Hx of thyroiditis; ADD (attention deficit disorder); Overweight (BMI 25.0-29.9); Vitamin D deficiency; Encounter for induction of labor; SVD  (spontaneous vaginal delivery); Postpartum care following vaginal delivery 3/22; Perineal laceration, second degree; Gestational diabetes mellitus (GDM), antepartum; and Rh negative, maternal / newborn Rh negative on their problem list. Surgical History:  She  has a past surgical history that includes Wisdom tooth extraction. Family History:  Her family history includes Atrial fibrillation in her paternal grandmother; Breast cancer in her maternal grandmother; Diabetes in her paternal grandfather and paternal grandmother; Heart disease in her paternal grandfather; Heart failure in her paternal grandfather; Hyperlipidemia in her father, maternal grandmother, mother, and paternal grandfather; Hypertension in her maternal grandmother; Lung cancer in her maternal grandfather and maternal grandmother. Social History:   reports that she has never smoked. She has never used smokeless tobacco. She reports that she does not currently use alcohol. She reports that she does not  use drugs.  Review of Systems: Review of Systems  Constitutional:  Negative for chills and fever.  HENT:  Negative for congestion, hearing loss, sinus pain, sore throat and tinnitus.   Eyes:  Negative for blurred vision and double vision.  Respiratory:  Negative for cough, hemoptysis, sputum production, shortness of breath and wheezing.   Cardiovascular:  Positive for palpitations. Negative for chest pain and leg swelling.  Gastrointestinal:  Positive for heartburn (relieved with pepcid). Negative for abdominal pain, constipation, diarrhea, nausea and vomiting.  Genitourinary:  Negative for dysuria and urgency.  Musculoskeletal:  Negative for back pain, falls, joint pain, myalgias and neck pain.  Skin:  Negative for rash.  Neurological:  Negative for dizziness, tingling, tremors, weakness and headaches.  Endo/Heme/Allergies:  Does not bruise/bleed easily.  Psychiatric/Behavioral:  Negative for depression and suicidal ideas. The  patient is not nervous/anxious and does not have insomnia.     Physical Exam: Estimated body mass index is 34.23 kg/m as calculated from the following:   Height as of this encounter: 5\' 4"  (1.626 m).   Weight as of this encounter: 199 lb 6.4 oz (90.4 kg). BP (!) 120/90   Pulse 85   Temp (!) 97.5 F (36.4 C)   Ht 5\' 4"  (1.626 m)   Wt 199 lb 6.4 oz (90.4 kg)   LMP 02/09/2023   SpO2 98%   BMI 34.23 kg/m  General Appearance: Well nourished, in no apparent distress.  Eyes: PERRLA, EOMs, conjunctiva no swelling or erythema Sinuses: No Frontal/maxillary tenderness  ENT/Mouth: Ext aud canals clear, normal light reflex with TMs without erythema, bulging. Good dentition. No erythema, swelling, or exudate on post pharynx.  Hearing normal.  Neck: Supple, thyroid normal. No bruits  Respiratory: Respiratory effort normal, BS equal bilaterally without rales, rhonchi, wheezing or stridor.  Cardio: RRR with rare PVC on auscultation. Brisk peripheral pulses without edema.  Chest: symmetric, with normal excursions and percussion.  Abdomen: Soft, + BS no masses or tenderness Lymphatics: Non tender without lymphadenopathy.  Genitourinary: defer to GYN Breast: defer to GYN Musculoskeletal: Full ROM all peripheral extremities,5/5 strength, and normal gait.  Skin: Warm, dry . Several molluscum noticed on both legs Neuro: Cranial nerves intact, reflexes equal bilaterally. Normal muscle tone, no cerebellar symptoms. Sensation intact.  Psych: Awake and oriented X 3, normal affect, Insight and Judgment appropriate.   EKG: NSR, no ST changes  Blythe Hartshorn Hollie Salk, NP 9:30 AM [facility]

## 2023-02-18 ENCOUNTER — Ambulatory Visit (INDEPENDENT_AMBULATORY_CARE_PROVIDER_SITE_OTHER): Payer: 59 | Admitting: Nurse Practitioner

## 2023-02-18 ENCOUNTER — Encounter: Payer: Self-pay | Admitting: Nurse Practitioner

## 2023-02-18 VITALS — BP 120/90 | HR 85 | Temp 97.5°F | Ht 64.0 in | Wt 199.4 lb

## 2023-02-18 DIAGNOSIS — R7309 Other abnormal glucose: Secondary | ICD-10-CM | POA: Diagnosis not present

## 2023-02-18 DIAGNOSIS — Z79899 Other long term (current) drug therapy: Secondary | ICD-10-CM

## 2023-02-18 DIAGNOSIS — Z0001 Encounter for general adult medical examination with abnormal findings: Secondary | ICD-10-CM

## 2023-02-18 DIAGNOSIS — Z0000 Encounter for general adult medical examination without abnormal findings: Secondary | ICD-10-CM | POA: Diagnosis not present

## 2023-02-18 DIAGNOSIS — E559 Vitamin D deficiency, unspecified: Secondary | ICD-10-CM | POA: Diagnosis not present

## 2023-02-18 DIAGNOSIS — Z862 Personal history of diseases of the blood and blood-forming organs and certain disorders involving the immune mechanism: Secondary | ICD-10-CM

## 2023-02-18 DIAGNOSIS — R002 Palpitations: Secondary | ICD-10-CM

## 2023-02-18 DIAGNOSIS — Z1389 Encounter for screening for other disorder: Secondary | ICD-10-CM | POA: Diagnosis not present

## 2023-02-18 DIAGNOSIS — Z1329 Encounter for screening for other suspected endocrine disorder: Secondary | ICD-10-CM | POA: Diagnosis not present

## 2023-02-18 DIAGNOSIS — E781 Pure hyperglyceridemia: Secondary | ICD-10-CM | POA: Diagnosis not present

## 2023-02-18 DIAGNOSIS — Z13220 Encounter for screening for lipoid disorders: Secondary | ICD-10-CM

## 2023-02-18 DIAGNOSIS — Z136 Encounter for screening for cardiovascular disorders: Secondary | ICD-10-CM | POA: Diagnosis not present

## 2023-02-18 DIAGNOSIS — B081 Molluscum contagiosum: Secondary | ICD-10-CM

## 2023-02-18 DIAGNOSIS — F988 Other specified behavioral and emotional disorders with onset usually occurring in childhood and adolescence: Secondary | ICD-10-CM

## 2023-02-18 DIAGNOSIS — F418 Other specified anxiety disorders: Secondary | ICD-10-CM

## 2023-02-18 DIAGNOSIS — E669 Obesity, unspecified: Secondary | ICD-10-CM

## 2023-02-18 MED ORDER — DIETHYLPROPION HCL 25 MG PO TABS: 1.0000 | ORAL_TABLET | Freq: Every day | ORAL | 0 refills | Status: DC

## 2023-02-18 NOTE — Patient Instructions (Signed)
Fair life protein shakes Eat more frequently - try not to go more than 6 hours without protein Aim for 90 grams of protein a day- 30 breakfast/30 lunch 30 dinner Try to keep net carbs less than 50 Net Carbs=Total Carbs-fiber- sugar alcohols Exercise heartrate 120-140(fat burning zone)- walking 20-30 minutes 4 days a week  

## 2023-02-19 LAB — CBC WITH DIFFERENTIAL/PLATELET
Absolute Monocytes: 396 {cells}/uL (ref 200–950)
Basophils Absolute: 54 {cells}/uL (ref 0–200)
Basophils Relative: 0.6 %
Eosinophils Absolute: 90 {cells}/uL (ref 15–500)
Eosinophils Relative: 1 %
HCT: 45.4 % — ABNORMAL HIGH (ref 35.0–45.0)
Hemoglobin: 15.8 g/dL — ABNORMAL HIGH (ref 11.7–15.5)
Lymphs Abs: 2565 {cells}/uL (ref 850–3900)
MCH: 30.3 pg (ref 27.0–33.0)
MCHC: 34.8 g/dL (ref 32.0–36.0)
MCV: 87 fL (ref 80.0–100.0)
MPV: 11.4 fL (ref 7.5–12.5)
Monocytes Relative: 4.4 %
Neutro Abs: 5895 {cells}/uL (ref 1500–7800)
Neutrophils Relative %: 65.5 %
Platelets: 264 10*3/uL (ref 140–400)
RBC: 5.22 10*6/uL — ABNORMAL HIGH (ref 3.80–5.10)
RDW: 12.4 % (ref 11.0–15.0)
Total Lymphocyte: 28.5 %
WBC: 9 10*3/uL (ref 3.8–10.8)

## 2023-02-19 LAB — COMPLETE METABOLIC PANEL WITH GFR
AG Ratio: 1.6 (calc) (ref 1.0–2.5)
ALT: 69 U/L — ABNORMAL HIGH (ref 6–29)
AST: 24 U/L (ref 10–30)
Albumin: 4.4 g/dL (ref 3.6–5.1)
Alkaline phosphatase (APISO): 93 U/L (ref 31–125)
BUN: 13 mg/dL (ref 7–25)
CO2: 27 mmol/L (ref 20–32)
Calcium: 9.2 mg/dL (ref 8.6–10.2)
Chloride: 105 mmol/L (ref 98–110)
Creat: 0.75 mg/dL (ref 0.50–0.96)
Globulin: 2.7 g/dL (ref 1.9–3.7)
Glucose, Bld: 82 mg/dL (ref 65–99)
Potassium: 4.1 mmol/L (ref 3.5–5.3)
Sodium: 140 mmol/L (ref 135–146)
Total Bilirubin: 0.4 mg/dL (ref 0.2–1.2)
Total Protein: 7.1 g/dL (ref 6.1–8.1)
eGFR: 110 mL/min/{1.73_m2} (ref >=60)

## 2023-02-19 LAB — MICROSCOPIC MESSAGE

## 2023-02-19 LAB — LIPID PANEL
Cholesterol: 161 mg/dL (ref <200)
HDL: 41 mg/dL — ABNORMAL LOW (ref >=50)
LDL Cholesterol (Calc): 97 mg/dL
Non-HDL Cholesterol (Calc): 120 mg/dL (ref <130)
Total CHOL/HDL Ratio: 3.9 (calc) (ref <5.0)
Triglycerides: 134 mg/dL (ref <150)

## 2023-02-19 LAB — HEMOGLOBIN A1C W/OUT EAG: Hgb A1c MFr Bld: 5.3 %{Hb} (ref <5.7)

## 2023-02-19 LAB — URINALYSIS, ROUTINE W REFLEX MICROSCOPIC
Bilirubin Urine: NEGATIVE
Glucose, UA: NEGATIVE
Hgb urine dipstick: NEGATIVE
Hyaline Cast: NONE SEEN /LPF
Ketones, ur: NEGATIVE
Nitrite: NEGATIVE
Protein, ur: NEGATIVE
RBC / HPF: NONE SEEN /HPF (ref 0–2)
Specific Gravity, Urine: 1.024 (ref 1.001–1.035)
pH: 5.5 (ref 5.0–8.0)

## 2023-02-19 LAB — MAGNESIUM: Magnesium: 1.9 mg/dL (ref 1.5–2.5)

## 2023-02-19 LAB — TSH: TSH: 1.35 m[IU]/L

## 2023-02-19 LAB — VITAMIN D 25 HYDROXY (VIT D DEFICIENCY, FRACTURES): Vit D, 25-Hydroxy: 44 ng/mL (ref 30–100)

## 2023-02-21 ENCOUNTER — Other Ambulatory Visit: Payer: Self-pay | Admitting: Nurse Practitioner

## 2023-02-21 DIAGNOSIS — R7989 Other specified abnormal findings of blood chemistry: Secondary | ICD-10-CM

## 2023-02-28 DIAGNOSIS — H5213 Myopia, bilateral: Secondary | ICD-10-CM | POA: Diagnosis not present

## 2023-03-03 ENCOUNTER — Ambulatory Visit (HOSPITAL_COMMUNITY)
Admission: RE | Admit: 2023-03-03 | Discharge: 2023-03-03 | Disposition: A | Discharge status: Home / Self Care | Payer: 59 | Source: Ambulatory Visit | Attending: Nurse Practitioner | Admitting: Nurse Practitioner

## 2023-03-03 DIAGNOSIS — R7989 Other specified abnormal findings of blood chemistry: Secondary | ICD-10-CM | POA: Diagnosis not present

## 2023-03-03 DIAGNOSIS — R932 Abnormal findings on diagnostic imaging of liver and biliary tract: Secondary | ICD-10-CM | POA: Diagnosis not present

## 2023-03-17 ENCOUNTER — Encounter: Payer: 59 | Admitting: Nurse Practitioner

## 2023-03-23 ENCOUNTER — Other Ambulatory Visit: Payer: Self-pay | Admitting: Nurse Practitioner

## 2023-03-23 DIAGNOSIS — E66811 Obesity, class 1: Secondary | ICD-10-CM

## 2023-03-23 MED ORDER — DIETHYLPROPION HCL 25 MG PO TABS: 1.0000 | ORAL_TABLET | Freq: Every day | ORAL | 0 refills | Status: DC

## 2023-04-12 ENCOUNTER — Other Ambulatory Visit: Payer: Self-pay | Admitting: Obstetrics and Gynecology

## 2023-05-17 ENCOUNTER — Ambulatory Visit (INDEPENDENT_AMBULATORY_CARE_PROVIDER_SITE_OTHER): Payer: 59 | Admitting: Nurse Practitioner

## 2023-05-17 ENCOUNTER — Encounter: Payer: Self-pay | Admitting: Nurse Practitioner

## 2023-05-17 VITALS — BP 110/78 | HR 106 | Temp 97.5°F | Ht 64.0 in | Wt 205.0 lb

## 2023-05-17 DIAGNOSIS — J029 Acute pharyngitis, unspecified: Secondary | ICD-10-CM | POA: Diagnosis not present

## 2023-05-17 DIAGNOSIS — J0110 Acute frontal sinusitis, unspecified: Secondary | ICD-10-CM

## 2023-05-17 DIAGNOSIS — G44209 Tension-type headache, unspecified, not intractable: Secondary | ICD-10-CM | POA: Diagnosis not present

## 2023-05-17 DIAGNOSIS — R051 Acute cough: Secondary | ICD-10-CM

## 2023-05-17 MED ORDER — AZITHROMYCIN 250 MG PO TABS: ORAL_TABLET | ORAL | 1 refills | Status: DC

## 2023-05-17 MED ORDER — DEXAMETHASONE SODIUM PHOSPHATE 10 MG/ML IJ SOLN
10.0000 mg | Freq: Once | INTRAMUSCULAR | Status: AC
  Administered 2023-05-17: 10 mg via INTRAMUSCULAR

## 2023-05-17 NOTE — Patient Instructions (Signed)

## 2023-05-17 NOTE — Progress Notes (Signed)
Assessment and Plan:  Athaliah Jerge was seen today for a episodic visit .  Diagnoses and all order for this visit:  Acute non-recurrent frontal sinusitis Decadron injection administered - patient tolerated well without complication.  Start Z-Pak as directed - take in full Stay well hydrated to keep mucus thin and productive May continue Alka-zeltzer cold and Sudafed  - dexamethasone (DECADRON) injection 10 mg - azithromycin (ZITHROMAX) 250 MG tablet; Take 2 tablets on  Day 1,  followed by 1 tablet  daily for 4 more days    for Sinusitis  /Bronchitis  Dispense: 6 each; Refill: 1  Acute non intractable tension-type headache Take Tylenol OTC as directed Push fluids   Acute cough Continue Promethazine cough syrup  Stay well hydrated to keep mucus thin and productive.  Sore throat Warm salt water gargles several times throughout the day OTC Chloraseptic spray and throat lozenges  Notify office for further evaluation and treatment, questions or concerns if s/s fail to improve. The risks and benefits of my recommendations, as well as other treatment options were discussed with the patient today. Questions were answered.  Further disposition pending results of labs. Discussed med's effects and SE's.    Over 20 minutes of exam, counseling, chart review, and critical decision making was performed.   Future Appointments  Date Time Provider Department Center  02/20/2024 10:00 AM Raynelle Dick, NP [facility] None    ------------------------------------------------------------------------------------------------------------------   HPI BP 110/78   Pulse (!) 106   Temp (!) 97.5 F (36.4 C)   Ht 5\' 4"  (1.626 m)   Wt 205 lb (93 kg)   SpO2 99%   BMI 35.19 kg/m    Patient complains of symptoms of a URI, possible sinusitis. Symptoms include achiness, congestion, cough described as productive, facial pain, headache described as tension, nasal congestion, sinus pressure, and sore  throat. Onset of symptoms was 1 week ago, and has been unchanged since that time. Treatment to date: antihistamines, cough suppressants, and decongestants.  Denies fever, chills, N/V.    Past Medical History:  Diagnosis Date   ADD (attention deficit disorder) 03/03/2014   GERD (gastroesophageal reflux disease)    Gestational diabetes    Hx of thyroiditis      No Known Allergies  Current Outpatient Medications on File Prior to Visit  Medication Sig   Ascorbic Acid (VITAMIN C) 500 MG CHEW Chew by mouth. (Patient not taking: Reported on 08/26/2022)   Cholecalciferol (VITAMIN D) 50 MCG (2000 UT) CAPS Take by mouth.   COLLAGEN PO Take by mouth.   Diethylpropion HCl 25 MG TABS Take 1 tablet (25 mg total) by mouth daily.   ibuprofen (ADVIL) 600 MG tablet Take 1 tablet (600 mg total) by mouth every 6 (six) hours.   Multiple Vitamins-Minerals (HAIR SKIN AND NAILS FORMULA PO) Take by mouth.   sertraline (ZOLOFT) 25 MG tablet Take 1 tablet (25 mg total) by mouth daily.   sertraline (ZOLOFT) 25 MG tablet Take 1 tablet (25 mg total) by mouth daily.   No current facility-administered medications on file prior to visit.    ROS: all negative except what is noted in the HPI.   Physical Exam:  BP 110/78   Pulse (!) 106   Temp (!) 97.5 F (36.4 C)   Ht 5\' 4"  (1.626 m)   Wt 205 lb (93 kg)   SpO2 99%   BMI 35.19 kg/m   General Appearance: NAD.  Awake, conversant and cooperative. Eyes: PERRLA, EOMs intact.  Sclera  white.  Conjunctiva without erythema. Sinuses: Frontal/maxillary tenderness.  No nasal discharge. Nares patent.  ENT/Mouth: Ext aud canals clear.  Bilateral TMs w/DOL and without erythema or bulging. Hearing intact.  Posterior pharynx without swelling or exudate.  Tonsils without swelling or erythema.  Neck: Supple.  No masses, nodules or thyromegaly. Respiratory: Effort is regular with non-labored breathing. Breath sounds are equal bilaterally without rales, rhonchi, wheezing or  stridor.  Cardio: RRR with no MRGs. Brisk peripheral pulses without edema.  Abdomen: Active BS in all four quadrants.  Soft and non-tender without guarding, rebound tenderness, hernias or masses. Lymphatics: Non tender without lymphadenopathy.  Musculoskeletal: Full ROM, 5/5 strength, normal ambulation.  No clubbing or cyanosis. Skin: Appropriate color for ethnicity. Warm without rashes, lesions, ecchymosis, ulcers.  Neuro: CN II-XII grossly normal. Normal muscle tone without cerebellar symptoms and intact sensation.   Psych: AO X 3,  appropriate mood and affect, insight and judgment.     Adela Glimpse, NP 2:36 PM [facility]

## 2023-05-30 ENCOUNTER — Encounter (HOSPITAL_BASED_OUTPATIENT_CLINIC_OR_DEPARTMENT_OTHER): Payer: Self-pay | Admitting: Obstetrics and Gynecology

## 2023-05-30 NOTE — Progress Notes (Signed)
Spoke w/ via phone for pre-op interview--- Kelsey Wheeler needs dos----CBC, T&S and HCG (qual) per surgeon         Wheeler results------ COVID test -----patient states asymptomatic no test needed Arrive at -------0700 NPO after MN NO Solid Food.  Med rec completed Medications to take morning of surgery -----NONE Diabetic medication ----- Patient instructed no nail polish to be worn day of surgery Patient instructed to bring photo id and insurance card day of surgery Patient aware to have Driver (ride ) / caregiver    for 24 hours after surgery - Husband Kelsey Wheeler Patient Special Instructions ----- Pre-Op special Instructions ----- Patient verbalized understanding of instructions that were given at this phone interview. Patient denies chest pain, sob, fever, cough at the interview.

## 2023-06-02 NOTE — Anesthesia Preprocedure Evaluation (Addendum)
Anesthesia Evaluation  Patient identified by MRN, date of birth, ID band Patient awake    Reviewed: Allergy & Precautions, NPO status , Patient's Chart, lab work & pertinent test results  Airway Mallampati: II  TM Distance: >3 FB Neck ROM: Full    Dental  (+) Teeth Intact, Dental Advisory Given   Pulmonary neg pulmonary ROS   Pulmonary exam normal breath sounds clear to auscultation       Cardiovascular negative cardio ROS Normal cardiovascular exam Rhythm:Regular Rate:Normal     Neuro/Psych negative neurological ROS  negative psych ROS   GI/Hepatic Neg liver ROS,GERD  Controlled,,  Endo/Other  diabetes, Well Controlled, Gestational  Obesity BMI 34  Renal/GU negative Renal ROS  negative genitourinary   Musculoskeletal negative musculoskeletal ROS (+)    Abdominal  (+) + obese  Peds  Hematology negative hematology ROS (+)   Anesthesia Other Findings   Reproductive/Obstetrics Desires sterilization                             Anesthesia Physical Anesthesia Plan  ASA: 2  Anesthesia Plan: General   Post-op Pain Management: Tylenol PO (pre-op)*, Toradol IV (intra-op)* and Precedex   Induction: Intravenous  PONV Risk Score and Plan: 3 and Ondansetron, Dexamethasone, Midazolam and Treatment may vary due to age or medical condition  Airway Management Planned: Oral ETT  Additional Equipment: None  Intra-op Plan:   Post-operative Plan: Extubation in OR  Informed Consent: I have reviewed the patients History and Physical, chart, labs and discussed the procedure including the risks, benefits and alternatives for the proposed anesthesia with the patient or authorized representative who has indicated his/her understanding and acceptance.     Dental advisory given  Plan Discussed with: CRNA  Anesthesia Plan Comments:        Anesthesia Quick Evaluation

## 2023-06-02 NOTE — H&P (Signed)
Kelsey Wheeler is an 27 y.o. female. Desires sterilization.   Pertinent Gynecological History: Menses: flow is moderate Bleeding: na Contraception: none DES exposure: denies Blood transfusions: none Sexually transmitted diseases: no past history Previous GYN Procedures:  na   Last mammogram:  na  Date: na Last pap: normal Date: nz OB History: G2, P2   Menstrual History: Menarche age: 25 No LMP recorded (lmp unknown). (Menstrual status: IUD).    Past Medical History:  Diagnosis Date   ADD (attention deficit disorder) 03/03/2014   GERD (gastroesophageal reflux disease)    Gestational diabetes    Hx of thyroiditis     Past Surgical History:  Procedure Laterality Date   WISDOM TOOTH EXTRACTION      Family History  Problem Relation Age of Onset   Hyperlipidemia Mother    Hyperlipidemia Father    Hyperlipidemia Maternal Grandmother    Hypertension Maternal Grandmother    Lung cancer Maternal Grandmother    Breast cancer Maternal Grandmother    Lung cancer Maternal Grandfather    Diabetes Paternal Grandfather    Heart disease Paternal Grandfather    Hyperlipidemia Paternal Grandfather    Heart failure Paternal Grandfather    Diabetes Paternal Grandmother    Atrial fibrillation Paternal Grandmother     Social History:  reports that she has never smoked. She has never used smokeless tobacco. She reports that she does not currently use alcohol. She reports that she does not use drugs.  Allergies: No Known Allergies  No medications prior to admission.    Review of Systems  Constitutional: Negative.   All other systems reviewed and are negative.   Height 5\' 4"  (1.626 m), weight 90.7 kg, not currently breastfeeding. Physical Exam Constitutional:      Appearance: Normal appearance.  HENT:     Head: Normocephalic and atraumatic.     Right Ear: Tympanic membrane normal.     Left Ear: Tympanic membrane normal.  Cardiovascular:     Rate and Rhythm: Normal rate and  regular rhythm.     Pulses: Normal pulses.  Pulmonary:     Breath sounds: Normal breath sounds.  Abdominal:     General: Bowel sounds are normal.     Palpations: Abdomen is soft.  Genitourinary:    General: Normal vulva.  Musculoskeletal:        General: Normal range of motion.     Cervical back: Normal range of motion.  Skin:    General: Skin is warm.  Neurological:     General: No focal deficit present.     Mental Status: She is alert and oriented to person, place, and time.  Psychiatric:        Mood and Affect: Mood normal.        Behavior: Behavior normal.     No results found for this or any previous visit (from the past 24 hours).  No results found.  Assessment/Plan: Desire for elective sterilization LS bilateral salpingectomy Risks of anesthesia , bleeding , injury to surrounding organs with need for repair discussed. Consent done. Risks of TL failure discussed.  Cleona Doubleday J 06/02/2023, 9:14 PM

## 2023-06-03 ENCOUNTER — Other Ambulatory Visit: Payer: Self-pay

## 2023-06-03 ENCOUNTER — Ambulatory Visit (HOSPITAL_BASED_OUTPATIENT_CLINIC_OR_DEPARTMENT_OTHER)
Admission: RE | Admit: 2023-06-03 | Discharge: 2023-06-03 | Disposition: A | Discharge status: Home / Self Care | Payer: 59 | Attending: Obstetrics and Gynecology | Admitting: Obstetrics and Gynecology

## 2023-06-03 ENCOUNTER — Encounter (HOSPITAL_BASED_OUTPATIENT_CLINIC_OR_DEPARTMENT_OTHER): Payer: Self-pay | Admitting: Obstetrics and Gynecology

## 2023-06-03 ENCOUNTER — Encounter (HOSPITAL_BASED_OUTPATIENT_CLINIC_OR_DEPARTMENT_OTHER)
Admission: RE | Disposition: A | Discharge status: Home / Self Care | Payer: Self-pay | Source: Home / Self Care | Attending: Obstetrics and Gynecology

## 2023-06-03 ENCOUNTER — Ambulatory Visit (HOSPITAL_BASED_OUTPATIENT_CLINIC_OR_DEPARTMENT_OTHER): Payer: 59 | Admitting: Anesthesiology

## 2023-06-03 DIAGNOSIS — Z302 Encounter for sterilization: Secondary | ICD-10-CM | POA: Diagnosis not present

## 2023-06-03 DIAGNOSIS — E669 Obesity, unspecified: Secondary | ICD-10-CM | POA: Insufficient documentation

## 2023-06-03 DIAGNOSIS — N736 Female pelvic peritoneal adhesions (postinfective): Secondary | ICD-10-CM | POA: Diagnosis not present

## 2023-06-03 DIAGNOSIS — N838 Other noninflammatory disorders of ovary, fallopian tube and broad ligament: Secondary | ICD-10-CM | POA: Diagnosis not present

## 2023-06-03 DIAGNOSIS — K660 Peritoneal adhesions (postprocedural) (postinfection): Secondary | ICD-10-CM

## 2023-06-03 DIAGNOSIS — Z6834 Body mass index (BMI) 34.0-34.9, adult: Secondary | ICD-10-CM | POA: Diagnosis not present

## 2023-06-03 DIAGNOSIS — Z01818 Encounter for other preprocedural examination: Secondary | ICD-10-CM

## 2023-06-03 HISTORY — PX: IUD REMOVAL: SHX5392

## 2023-06-03 HISTORY — PX: LAPAROSCOPIC BILATERAL SALPINGECTOMY: SHX5889

## 2023-06-03 HISTORY — PX: LAPAROSCOPIC LYSIS OF ADHESIONS: SHX5905

## 2023-06-03 LAB — CBC
HCT: 44.8 % (ref 36.0–46.0)
Hemoglobin: 15.4 g/dL — ABNORMAL HIGH (ref 12.0–15.0)
MCH: 30.1 pg (ref 26.0–34.0)
MCHC: 34.4 g/dL (ref 30.0–36.0)
MCV: 87.5 fL (ref 80.0–100.0)
Platelets: 290 10*3/uL (ref 150–400)
RBC: 5.12 MIL/uL — ABNORMAL HIGH (ref 3.87–5.11)
RDW: 12.1 % (ref 11.5–15.5)
WBC: 11.9 10*3/uL — ABNORMAL HIGH (ref 4.0–10.5)
nRBC: 0 % (ref 0.0–0.2)

## 2023-06-03 LAB — TYPE AND SCREEN
ABO/RH(D): O NEG
Antibody Screen: NEGATIVE

## 2023-06-03 LAB — HCG, SERUM, QUALITATIVE: Preg, Serum: NEGATIVE

## 2023-06-03 SURGERY — SALPINGECTOMY, BILATERAL, LAPAROSCOPIC
Anesthesia: General | Laterality: Bilateral

## 2023-06-03 MED ORDER — SODIUM CHLORIDE 0.9 % IV SOLN: INTRAVENOUS | Status: DC

## 2023-06-03 MED ORDER — SODIUM CHLORIDE 0.9 % IV SOLN: INTRAVENOUS | Status: DC | PRN

## 2023-06-03 MED ORDER — MEPERIDINE HCL 25 MG/ML IJ SOLN: 6.2500 mg | INTRAMUSCULAR | Status: DC | PRN

## 2023-06-03 MED ORDER — AMISULPRIDE (ANTIEMETIC) 5 MG/2ML IV SOLN
INTRAVENOUS | Status: AC
  Filled 2023-06-03: qty 2

## 2023-06-03 MED ORDER — MIDAZOLAM HCL 2 MG/2ML IJ SOLN
INTRAMUSCULAR | Status: DC | PRN
  Administered 2023-06-03: 2 mg via INTRAVENOUS

## 2023-06-03 MED ORDER — ONDANSETRON HCL 4 MG/2ML IJ SOLN
INTRAMUSCULAR | Status: DC | PRN
  Administered 2023-06-03: 4 mg via INTRAVENOUS

## 2023-06-03 MED ORDER — ROCURONIUM BROMIDE 10 MG/ML (PF) SYRINGE
PREFILLED_SYRINGE | INTRAVENOUS | Status: DC | PRN
  Administered 2023-06-03: 80 mg via INTRAVENOUS

## 2023-06-03 MED ORDER — ONDANSETRON HCL 4 MG/2ML IJ SOLN: 4.0000 mg | Freq: Once | INTRAMUSCULAR | Status: DC | PRN

## 2023-06-03 MED ORDER — DEXAMETHASONE SODIUM PHOSPHATE 10 MG/ML IJ SOLN
INTRAMUSCULAR | Status: DC | PRN
  Administered 2023-06-03: 10 mg via INTRAVENOUS

## 2023-06-03 MED ORDER — KETOROLAC TROMETHAMINE 30 MG/ML IJ SOLN: 30.0000 mg | Freq: Once | INTRAMUSCULAR | Status: DC | PRN

## 2023-06-03 MED ORDER — POVIDONE-IODINE 10 % EX SWAB
2.0000 | Freq: Once | CUTANEOUS | Status: DC
Start: 2023-06-03 — End: 2023-06-03

## 2023-06-03 MED ORDER — DEXMEDETOMIDINE HCL IN NACL 200 MCG/50ML IV SOLN
INTRAVENOUS | Status: DC | PRN
  Administered 2023-06-03: 4 ug via INTRAVENOUS
  Administered 2023-06-03: 8 ug via INTRAVENOUS

## 2023-06-03 MED ORDER — DEXMEDETOMIDINE HCL IN NACL 80 MCG/20ML IV SOLN
INTRAVENOUS | Status: AC
  Filled 2023-06-03: qty 20

## 2023-06-03 MED ORDER — ONDANSETRON HCL 4 MG/2ML IJ SOLN
INTRAMUSCULAR | Status: AC
  Filled 2023-06-03: qty 2

## 2023-06-03 MED ORDER — SCOPOLAMINE 1 MG/3DAYS TD PT72
MEDICATED_PATCH | TRANSDERMAL | Status: AC
  Filled 2023-06-03: qty 1

## 2023-06-03 MED ORDER — ONDANSETRON HCL 4 MG/2ML IJ SOLN
4.0000 mg | Freq: Once | INTRAMUSCULAR | Status: AC
  Administered 2023-06-03: 4 mg via INTRAVENOUS

## 2023-06-03 MED ORDER — PROPOFOL 10 MG/ML IV BOLUS
INTRAVENOUS | Status: DC | PRN
  Administered 2023-06-03: 200 mg via INTRAVENOUS

## 2023-06-03 MED ORDER — OXYCODONE HCL 5 MG PO TABS: 5.0000 mg | ORAL_TABLET | Freq: Four times a day (QID) | ORAL | 0 refills | Status: AC | PRN

## 2023-06-03 MED ORDER — PHENYLEPHRINE 80 MCG/ML (10ML) SYRINGE FOR IV PUSH (FOR BLOOD PRESSURE SUPPORT)
PREFILLED_SYRINGE | INTRAVENOUS | Status: DC | PRN
  Administered 2023-06-03: 160 ug via INTRAVENOUS
  Administered 2023-06-03: 80 ug via INTRAVENOUS
  Administered 2023-06-03 (×3): 160 ug via INTRAVENOUS
  Administered 2023-06-03: 80 ug via INTRAVENOUS
  Administered 2023-06-03: 160 ug via INTRAVENOUS
  Administered 2023-06-03: 80 ug via INTRAVENOUS

## 2023-06-03 MED ORDER — OXYCODONE HCL 5 MG/5ML PO SOLN: 5.0000 mg | Freq: Once | ORAL | Status: AC | PRN

## 2023-06-03 MED ORDER — OXYCODONE HCL 5 MG PO TABS
5.0000 mg | ORAL_TABLET | Freq: Once | ORAL | Status: AC | PRN
Start: 2023-06-03 — End: 2023-06-03
  Administered 2023-06-03: 5 mg via ORAL

## 2023-06-03 MED ORDER — OXYCODONE HCL 5 MG PO TABS
ORAL_TABLET | ORAL | Status: AC
  Filled 2023-06-03: qty 1

## 2023-06-03 MED ORDER — KETOROLAC TROMETHAMINE 30 MG/ML IJ SOLN
INTRAMUSCULAR | Status: DC | PRN
  Administered 2023-06-03: 30 mg via INTRAVENOUS

## 2023-06-03 MED ORDER — MIDAZOLAM HCL 2 MG/2ML IJ SOLN
INTRAMUSCULAR | Status: AC
Start: 2023-06-03 — End: ?
  Filled 2023-06-03: qty 2

## 2023-06-03 MED ORDER — SCOPOLAMINE 1 MG/3DAYS TD PT72
1.0000 | MEDICATED_PATCH | TRANSDERMAL | Status: DC
  Administered 2023-06-03: 1.5 mg via TRANSDERMAL

## 2023-06-03 MED ORDER — CEFAZOLIN SODIUM-DEXTROSE 2-4 GM/100ML-% IV SOLN
INTRAVENOUS | Status: AC
  Filled 2023-06-03: qty 100

## 2023-06-03 MED ORDER — ACETAMINOPHEN 500 MG PO TABS
ORAL_TABLET | ORAL | Status: AC
  Filled 2023-06-03: qty 2

## 2023-06-03 MED ORDER — SODIUM CHLORIDE (PF) 0.9 % IJ SOLN
INTRAMUSCULAR | Status: DC | PRN
  Administered 2023-06-03: 10 mL

## 2023-06-03 MED ORDER — AMISULPRIDE (ANTIEMETIC) 5 MG/2ML IV SOLN
10.0000 mg | Freq: Once | INTRAVENOUS | Status: AC | PRN
  Administered 2023-06-03: 10 mg via INTRAVENOUS

## 2023-06-03 MED ORDER — FENTANYL CITRATE (PF) 100 MCG/2ML IJ SOLN
INTRAMUSCULAR | Status: AC
  Filled 2023-06-03: qty 2

## 2023-06-03 MED ORDER — HYDROMORPHONE HCL 1 MG/ML IJ SOLN: 0.2500 mg | INTRAMUSCULAR | Status: DC | PRN

## 2023-06-03 MED ORDER — FENTANYL CITRATE (PF) 250 MCG/5ML IJ SOLN
INTRAMUSCULAR | Status: DC | PRN
  Administered 2023-06-03: 50 ug via INTRAVENOUS
  Administered 2023-06-03: 100 ug via INTRAVENOUS

## 2023-06-03 MED ORDER — PHENYLEPHRINE 80 MCG/ML (10ML) SYRINGE FOR IV PUSH (FOR BLOOD PRESSURE SUPPORT)
PREFILLED_SYRINGE | INTRAVENOUS | Status: AC
  Filled 2023-06-03: qty 30

## 2023-06-03 MED ORDER — BUPIVACAINE LIPOSOME 1.3 % IJ SUSP
INTRAMUSCULAR | Status: DC | PRN
  Administered 2023-06-03: 30 mL

## 2023-06-03 MED ORDER — CEFAZOLIN SODIUM-DEXTROSE 2-4 GM/100ML-% IV SOLN
2.0000 g | INTRAVENOUS | Status: AC
  Administered 2023-06-03: 2 g via INTRAVENOUS

## 2023-06-03 MED ORDER — ACETAMINOPHEN 500 MG PO TABS
1000.0000 mg | ORAL_TABLET | Freq: Once | ORAL | Status: AC
Start: 2023-06-03 — End: 2023-06-03
  Administered 2023-06-03: 1000 mg via ORAL

## 2023-06-03 MED ORDER — LIDOCAINE 2% (20 MG/ML) 5 ML SYRINGE
INTRAMUSCULAR | Status: DC | PRN
  Administered 2023-06-03: 80 mg via INTRAVENOUS

## 2023-06-03 MED ORDER — SUGAMMADEX SODIUM 200 MG/2ML IV SOLN
INTRAVENOUS | Status: DC | PRN
  Administered 2023-06-03: 200 mg via INTRAVENOUS

## 2023-06-03 SURGICAL SUPPLY — 33 items
CABLE HIGH FREQUENCY MONO STRZ (ELECTRODE) IMPLANT
CATH ROBINSON RED A/P 16FR (CATHETERS) IMPLANT
DERMABOND ADVANCED .7 DNX12 (GAUZE/BANDAGES/DRESSINGS) ×2 IMPLANT
DRAPE SURG IRRIG POUCH 19X23 (DRAPES) ×2 IMPLANT
DRSG OPSITE POSTOP 3X4 (GAUZE/BANDAGES/DRESSINGS) IMPLANT
FORCEPS CUTTING 33CM 5MM (CUTTING FORCEPS) IMPLANT
FORCEPS CUTTING 45CM 5MM (CUTTING FORCEPS) IMPLANT
GAUZE 4X4 16PLY ~~LOC~~+RFID DBL (SPONGE) ×2 IMPLANT
GLOVE BIO SURGEON STRL SZ7.5 (GLOVE) ×2 IMPLANT
GLOVE BIOGEL PI IND STRL 7.0 (GLOVE) ×2 IMPLANT
GOWN STRL REUS W/TWL LRG LVL3 (GOWN DISPOSABLE) ×2 IMPLANT
IRRIG SUCT STRYKERFLOW 2 WTIP (MISCELLANEOUS) ×2 IMPLANT
IRRIGATION SUCT STRKRFLW 2 WTP (MISCELLANEOUS) ×2 IMPLANT
KIT PINK PAD W/HEAD ARE REST (MISCELLANEOUS) ×2 IMPLANT
KIT PINK PAD W/HEAD ARM REST (MISCELLANEOUS) ×2 IMPLANT
KIT TURNOVER CYSTO (KITS) ×2 IMPLANT
NDL INSUFFLATION 14GA 120MM (NEEDLE) ×2 IMPLANT
NEEDLE INSUFFLATION 14GA 120MM (NEEDLE) ×2 IMPLANT
PACK LAPAROSCOPY BASIN (CUSTOM PROCEDURE TRAY) ×2 IMPLANT
PROTECTOR NERVE ULNAR (MISCELLANEOUS) ×4 IMPLANT
SCISSORS LAP 5X35 DISP (ENDOMECHANICALS) IMPLANT
SET TUBE SMOKE EVAC HIGH FLOW (TUBING) ×2 IMPLANT
SLEEVE SCD COMPRESS KNEE MED (STOCKING) ×2 IMPLANT
SOL ELECTROSURG ANTI STICK (MISCELLANEOUS) ×2 IMPLANT
SOLUTION ELECTROSURG ANTI STCK (MISCELLANEOUS) ×2 IMPLANT
SUT VIC AB 4-0 PS2 18 (SUTURE) ×4 IMPLANT
SUT VICRYL 0 UR6 27IN ABS (SUTURE) ×2 IMPLANT
SYS BAG RETRIEVAL 10MM (BASKET) IMPLANT
SYSTEM BAG RETRIEVAL 10MM (BASKET) IMPLANT
TRAY FOLEY W/BAG SLVR 14FR LF (SET/KITS/TRAYS/PACK) ×2 IMPLANT
TROCAR Z-THREAD BLADED 11X100M (TROCAR) ×2 IMPLANT
TROCAR Z-THREAD BLADED 5X100MM (TROCAR) ×2 IMPLANT
WARMER LAPAROSCOPE (MISCELLANEOUS) ×2 IMPLANT

## 2023-06-03 NOTE — Op Note (Signed)
Kelsey Wheeler, Kelsey Wheeler MEDICAL RECORD NO: [id number] ACCOUNT NO: [id number] DATE OF BIRTH: 1995/11/03 FACILITY: [facility] LOCATION: [facility] PHYSICIAN: Lenoard Aden, MD  Operative Report   DATE OF PROCEDURE: 06/03/2023  PREOPERATIVE DIAGNOSIS:  Desire for elective sterilization for complete bilateral salpingectomy.  POSTOPERATIVE DIAGNOSES:  Desire for elective sterilization for complete bilateral salpingectomy and left sigmoid adhesions to left periadnexal pelvic sidewall.  PROCEDURE: 1.  Laparoscopic bilateral salpingectomy. 2.  Laparoscopic lysis of sigmoid adhesions to the left adnexa. 3.  Intrauterine device removal.  SURGEON:  Lenoard Aden, MD  ASSISTANT:  Diorio.  ESTIMATED BLOOD LOSS:  5 mL.  COMPLICATIONS:  None.  DRAINS:  None.  COUNTS:  Correct.  ANESTHESIA:  Local and general.  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  DISPOSITION:  The patient to recovery in good condition.  BRIEF OPERATIVE NOTE:  After being appraised of the risks of anesthesia, infection, bleeding, injury to surrounding organs, possible need for repair, delayed versus immediate complications include bowel or bladder injury, possible need for repair.  The  patient's consent signed and brought to the operating room where she was administered general anesthetic in dorsal lithotomy position.  The feet were placed in the Yellofin stirrups.  Exam under anesthesia revealed anteflexed normal-sized uterus and no  adnexal masses appreciated.  A Hulka tenaculum was placed vaginally after removal of an intact IUD.  At this time, an infraumbilical incision was made with a scalpel and Veress needle placed.  Opening pressure of 3 noted.  3 liters of CO2 insufflated  without difficulty.  Trocar was placed without difficulty.  Visualization reveals normal liver gallbladder area.  Appendix not visualized.  Normal ovaries.  Normal anterior and posterior cul-de-sac.  There are  adhesions of the left sigmoid mesentery  along the left adnexal area obscuring the left ovary and left tube.  Two 5-mm ports were placed under direct visualization in the right and left and bipolar scissors were entered to sharply lyse the adhesions in the left adnexa without difficulty.   Ureters identified along the left pelvic sidewall and left ovary appears normal.  The left tube appears otherwise normal.  After complete exposure of the left adnexa, the left tube was elevated using a grasper and undermined in its entirety using a gyrus  bipolar instrument and removed about 1 cm distal to the left uterine cornua.  This specimen was removed through the operative port.  The same procedure was then done on the right side undermining the right mesosalpinx using the gyrus and complete  removal of the right tube was done as well.  Good hemostasis was assured.  CO2 was released and re-visualization revealed no evidence of bleeding.  At this time, then CO2 was released.  Positive pressure was applied.  All trocars were removed under  direct visualization.  The incisions closed using 0 Vicryl and a 4-0 Vicryl and Dermabond.  Exparel, Marcaine solution placed.  Hulka tenaculum removed vaginally.  The patient tolerated the procedure well and was transferred to recovery in good  condition.   PUS D: 06/03/2023 10:51:07 am T: 06/03/2023 1:08:00 pm  JOB: [id number]/ [id number]

## 2023-06-03 NOTE — Op Note (Signed)
06/03/2023  10:42 AM  PATIENT:  Kelsey Wheeler  27 y.o. female  PRE-OPERATIVE DIAGNOSIS:  Desires Sterilization  POST-OPERATIVE DIAGNOSIS:  Desires Sterilization SIGMOID COLON TO LEFT ADNEXA ADHESIONS  PROCEDURE:  Procedure(s): LAPAROSCOPIC BILATERAL SALPINGECTOMY LAPAROSCOPIC LYSIS OF  SIGMOID ADHESIONS INTRAUTERINE DEVICE (IUD) REMOVAL  SURGEON:  Surgeon(s): Olivia Mackie, MD  ASSISTANTS:   ANESTHESIA:   local and general  ESTIMATED BLOOD LOSS: 5 mL   DRAINS: none   LOCAL MEDICATIONS USED:  MARCAINE    and Amount: 30 ml  SPECIMEN:  Source of Specimen:  BILATERAL TUBES  DISPOSITION OF SPECIMEN:  PATHOLOGY  COUNTS:  YES  DICTATION #: [id number]  PLAN OF CARE: DC HOME  PATIENT DISPOSITION:  PACU - hemodynamically stable.

## 2023-06-03 NOTE — Anesthesia Postprocedure Evaluation (Signed)
Anesthesia Post Note  Patient: Fatin Carney  Procedure(s) Performed: LAPAROSCOPIC BILATERAL SALPINGECTOMY (Bilateral) LAPAROSCOPIC LYSIS OF  SIGMOID ADHESIONS INTRAUTERINE DEVICE (IUD) REMOVAL     Patient location during evaluation: PACU Anesthesia Type: General Level of consciousness: awake and alert, oriented and patient cooperative Pain management: pain level controlled Vital Signs Assessment: post-procedure vital signs reviewed and stable Respiratory status: spontaneous breathing, nonlabored ventilation and respiratory function stable Cardiovascular status: blood pressure returned to baseline and stable Postop Assessment: no apparent nausea or vomiting Anesthetic complications: no   No notable events documented.  Last Vitals:  Vitals:   06/03/23 0741 06/03/23 1052  BP: (!) 136/99 121/73  Pulse: 94 86  Resp: 17 16  Temp: 36.6 C 36.8 C  SpO2: 98% 100%    Last Pain:  Vitals:   06/03/23 0741  TempSrc: Oral  PainSc: 0-No pain                 Lannie Fields

## 2023-06-03 NOTE — Discharge Instr - Supplementary Instructions (Signed)
May take after 4:23pm if needed for discomfort. May take Tylenol after 1:38pm if needed for discomfort.

## 2023-06-03 NOTE — Transfer of Care (Signed)
Immediate Anesthesia Transfer of Care Note  Patient: Kelsey Wheeler  Procedure(s) Performed: LAPAROSCOPIC BILATERAL SALPINGECTOMY (Bilateral) LAPAROSCOPIC LYSIS OF  SIGMOID ADHESIONS INTRAUTERINE DEVICE (IUD) REMOVAL  Patient Location: PACU  Anesthesia Type:General  Level of Consciousness: drowsy and patient cooperative  Airway & Oxygen Therapy: Patient Spontanous Breathing  Post-op Assessment: Report given to RN and Post -op Vital signs reviewed and stable  Post vital signs: Reviewed and stable  Last Vitals:  Vitals Value Taken Time  BP 136/99 06/03/23 0741  Temp 36.6 C 06/03/23 0741  Pulse 94 06/03/23 0741  Resp 17 06/03/23 0741  SpO2 98 % 06/03/23 0741    Last Pain:  Vitals:   06/03/23 0741  TempSrc: Oral  PainSc: 0-No pain      Patients Stated Pain Goal: 5 (06/03/23 0741)  Complications: No notable events documented.

## 2023-06-03 NOTE — Anesthesia Procedure Notes (Signed)
Procedure Name: Intubation Date/Time: 06/03/2023 9:27 AM  Performed by: Carlos American, CRNAPre-anesthesia Checklist: Patient identified, Emergency Drugs available, Suction available and Patient being monitored Patient Re-evaluated:Patient Re-evaluated prior to induction Oxygen Delivery Method: Circle System Utilized Preoxygenation: Pre-oxygenation with 100% oxygen Induction Type: IV induction Ventilation: Mask ventilation without difficulty Laryngoscope Size: Mac and 3 Grade View: Grade I Tube type: Oral Tube size: 7.0 mm Number of attempts: 1 Airway Equipment and Method: Stylet Placement Confirmation: ETT inserted through vocal cords under direct vision, positive ETCO2 and breath sounds checked- equal and bilateral Secured at: 22 cm Tube secured with: Tape Dental Injury: Teeth and Oropharynx as per pre-operative assessment

## 2023-06-03 NOTE — Discharge Instructions (Addendum)
°  Post Anesthesia Home Care Instructions  Activity: Get plenty of rest for the remainder of the day. A responsible adult should stay with you for 24 hours following the procedure.  For the next 24 hours, DO NOT: -Drive a car -Operate machinery -Drink alcoholic beverages -Take any medication unless instructed by your physician -Make any legal decisions or sign important papers.  Meals: Start with liquid foods such as gelatin or soup. Progress to regular foods as tolerated. Avoid greasy, spicy, heavy foods. If nausea and/or vomiting occur, drink only clear liquids until the nausea and/or vomiting subsides. Call your physician if vomiting continues.  Special Instructions/Symptoms: Your throat may feel dry or sore from the anesthesia or the breathing tube placed in your throat during surgery. If this causes discomfort, gargle with warm salt water. The discomfort should disappear within 24 hours.  If you had a scopolamine patch placed behind your ear for the management of post- operative nausea and/or vomiting:  1. The medication in the patch is effective for 72 hours, after which it should be removed.  Wrap patch in a tissue and discard in the trash. Wash hands thoroughly with soap and water. 2. You may remove the patch earlier than 72 hours if you experience unpleasant side effects which may include dry mouth, dizziness or visual disturbances. 3. Avoid touching the patch. Wash your hands with soap and water after contact with the patch.   Information for Discharge Teaching: EXPAREL (bupivacaine liposome injectable suspension)   Your surgeon gave you EXPAREL(bupivacaine) in your surgical incision to help control your pain after surgery.   EXPAREL is a local anesthetic that provides pain relief by numbing the tissue around the surgical site.  EXPAREL is designed to release pain medication over time and can control pain for up to 72 hours.  Depending on how you respond to EXPAREL, you may  require less pain medication during your recovery.  Possible side effects:  Temporary loss of sensation or ability to move in the area where bupivacaine was injected.  Nausea, vomiting, constipation  Rarely, numbness and tingling in your mouth or lips, lightheadedness, or anxiety may occur.  Call your doctor right away if you think you may be experiencing any of these sensations, or if you have other questions regarding possible side effects.  Follow all other discharge instructions given to you by your surgeon or nurse. Eat a healthy diet and drink plenty of water or other fluids.  If you return to the hospital for any reason within 96 hours following the administration of EXPAREL, please inform your health care providers. 

## 2023-06-04 ENCOUNTER — Encounter (HOSPITAL_BASED_OUTPATIENT_CLINIC_OR_DEPARTMENT_OTHER): Payer: Self-pay | Admitting: Obstetrics and Gynecology

## 2023-06-07 LAB — SURGICAL PATHOLOGY

## 2023-06-27 ENCOUNTER — Other Ambulatory Visit: Payer: Self-pay

## 2023-07-20 ENCOUNTER — Encounter: Payer: Self-pay | Admitting: Nurse Practitioner

## 2023-10-17 DIAGNOSIS — Z124 Encounter for screening for malignant neoplasm of cervix: Secondary | ICD-10-CM | POA: Diagnosis not present

## 2023-10-17 DIAGNOSIS — Z01411 Encounter for gynecological examination (general) (routine) with abnormal findings: Secondary | ICD-10-CM | POA: Diagnosis not present

## 2023-10-17 DIAGNOSIS — Z1331 Encounter for screening for depression: Secondary | ICD-10-CM | POA: Diagnosis not present

## 2023-10-17 DIAGNOSIS — Z113 Encounter for screening for infections with a predominantly sexual mode of transmission: Secondary | ICD-10-CM | POA: Diagnosis not present

## 2023-10-17 DIAGNOSIS — Z01419 Encounter for gynecological examination (general) (routine) without abnormal findings: Secondary | ICD-10-CM | POA: Diagnosis not present

## 2024-02-20 ENCOUNTER — Encounter: Payer: 59 | Admitting: Nurse Practitioner

## 2024-02-27 ENCOUNTER — Telehealth: Admitting: Family Medicine

## 2024-02-27 DIAGNOSIS — J069 Acute upper respiratory infection, unspecified: Secondary | ICD-10-CM

## 2024-02-27 MED ORDER — IPRATROPIUM BROMIDE 0.03 % NA SOLN: 2.0000 | Freq: Two times a day (BID) | NASAL | 0 refills | Status: DC

## 2024-02-27 MED ORDER — AMOXICILLIN-POT CLAVULANATE 875-125 MG PO TABS: 1.0000 | ORAL_TABLET | Freq: Two times a day (BID) | ORAL | 0 refills | Status: AC

## 2024-02-27 MED ORDER — PROMETHAZINE-DM 6.25-15 MG/5ML PO SYRP: 5.0000 mL | ORAL_SOLUTION | Freq: Four times a day (QID) | ORAL | 0 refills | Status: DC | PRN

## 2024-02-27 MED ORDER — BENZONATATE 100 MG PO CAPS: 100.0000 mg | ORAL_CAPSULE | Freq: Three times a day (TID) | ORAL | 0 refills | Status: DC | PRN

## 2024-02-27 NOTE — Addendum Note (Signed)
 Addended by: MOISHE CHIQUITA HERO on: 02/27/2024 08:49 AM   Modules accepted: Orders

## 2024-02-27 NOTE — Progress Notes (Signed)
 E-Visit for Upper Respiratory Infection   We are sorry you are not feeling well.  Here is how we plan to help!  Based on what you have shared with me, it looks like you may have a viral upper respiratory infection.  Upper respiratory infections are caused by a large number of viruses; however, rhinovirus is the most common cause.   Symptoms vary from person to person, with common symptoms including sore throat, cough, fatigue or lack of energy and feeling of general discomfort.  A low-grade fever of up to 100.4 may present, but is often uncommon.  Symptoms vary however, and are closely related to a person's age or underlying illnesses.  The most common symptoms associated with an upper respiratory infection are nasal discharge or congestion, cough, sneezing, headache and pressure in the ears and face.  These symptoms usually persist for about 3 to 10 days, but can last up to 2 weeks.  It is important to know that upper respiratory infections do not cause serious illness or complications in most cases.    Upper respiratory infections can be transmitted from person to person, with the most common method of transmission being a person's hands.  The virus is able to live on the skin and can infect other persons for up to 2 hours after direct contact.  Also, these can be transmitted when someone coughs or sneezes; thus, it is important to cover the mouth to reduce this risk.  To keep the spread of the illness at bay, good hand hygiene is very important.  This is an infection that is most likely caused by a virus. There are no specific treatments other than to help you with the symptoms until the infection runs its course.  We are sorry you are not feeling well.  Here is how we plan to help!   For nasal congestion, you may use an oral decongestants such as Mucinex D or if you have glaucoma or high blood pressure use plain Mucinex.  Saline nasal spray or nasal drops can help and can safely be used as often as  needed for congestion.  For your congestion, I have prescribed Ipratropium Bromide  nasal spray 0.03% two sprays in each nostril 2-3 times a day  If you do not have a history of heart disease, hypertension, diabetes or thyroid  disease, prostate/bladder issues or glaucoma, you may also use Sudafed to treat nasal congestion.  It is highly recommended that you consult with a pharmacist or your primary care physician to ensure this medication is safe for you to take.     If you have a cough, you may use cough suppressants such as Delsym and Robitussin.  If you have glaucoma or high blood pressure, you can also use Coricidin HBP.   For cough I have prescribed for you A prescription cough medication called Tessalon  Perles 100 mg. You may take 1-2 capsules every 8 hours as needed for cough And Promethazine  DM cough syrup- please note this can make you sleepy.   If you have a sore or scratchy throat, use a saltwater gargle-  to  teaspoon of salt dissolved in a 4-ounce to 8-ounce glass of warm water.  Gargle the solution for approximately 15-30 seconds and then spit.  It is important not to swallow the solution.  You can also use throat lozenges/cough drops and Chloraseptic spray to help with throat pain or discomfort.  Warm or cold liquids can also be helpful in relieving throat pain.  For headache, pain  or general discomfort, you can use Ibuprofen  or Tylenol  as directed.   Some authorities believe that zinc sprays or the use of Echinacea may shorten the course of your symptoms.   HOME CARE Only take medications as instructed by your medical team. Be sure to drink plenty of fluids. Water is fine as well as fruit juices, sodas and electrolyte beverages. You may want to stay away from caffeine or alcohol. If you are nauseated, try taking small sips of liquids. How do you know if you are getting enough fluid? Your urine should be a pale yellow or almost colorless. Get rest. Taking a steamy shower or using a  humidifier may help nasal congestion and ease sore throat pain. You can place a towel over your head and breathe in the steam from hot water coming from a faucet. Using a saline nasal spray works much the same way. Cough drops, hard candies and sore throat lozenges may ease your cough. Avoid close contacts especially the very young and the elderly Cover your mouth if you cough or sneeze Always remember to wash your hands.   GET HELP RIGHT AWAY IF: You develop worsening fever. If your symptoms do not improve within 10 days You develop yellow or green discharge from your nose over 3 days. You have coughing fits You develop a severe head ache or visual changes. You develop shortness of breath, difficulty breathing or start having chest pain Your symptoms persist after you have completed your treatment plan  MAKE SURE YOU  Understand these instructions. Will watch your condition. Will get help right away if you are not doing well or get worse.  Thank you for choosing an e-visit.  Your e-visit answers were reviewed by a board certified advanced clinical practitioner to complete your personal care plan. Depending upon the condition, your plan could have included both over the counter or prescription medications.  Please review your pharmacy choice. Make sure the pharmacy is open so you can pick up prescription now. If there is a problem, you may contact your provider through [employer] and have the prescription routed to another pharmacy.  Your safety is important to us . If you have drug allergies check your prescription carefully.   For the next 24 hours you can use MyChart to ask questions about today's visit, request a non-urgent call back, or ask for a work or school excuse. You will get an email in the next two days asking about your experience. I hope that your e-visit has been valuable and will speed your recovery.    I provided 5 minutes of non face-to-face time during this  encounter for chart review, medication and order placement, as well as and documentation.

## 2024-02-27 NOTE — Progress Notes (Signed)
 Delay start ABX sent for 9/17 if failing to improve

## 2024-03-12 DIAGNOSIS — F418 Other specified anxiety disorders: Secondary | ICD-10-CM | POA: Diagnosis not present

## 2024-03-16 ENCOUNTER — Telehealth: Admitting: Physician Assistant

## 2024-03-16 DIAGNOSIS — J069 Acute upper respiratory infection, unspecified: Secondary | ICD-10-CM

## 2024-03-16 MED ORDER — AZELASTINE HCL 0.1 % NA SOLN: 1.0000 | Freq: Two times a day (BID) | NASAL | 0 refills | Status: DC

## 2024-03-16 NOTE — Progress Notes (Signed)
 We are sorry that you are not feeling well.  Here is how we plan to help!  Based on what you have shared with me it looks like you have sinusitis.  Sinusitis is inflammation and infection in the sinus cavities of the head.  Based on your presentation I believe you most likely have Acute Viral Sinusitis.This is an infection most likely caused by a virus. There is not specific treatment for viral sinusitis other than to help you with the symptoms until the infection runs its course.  You may use an oral decongestant such as Mucinex D or if you have glaucoma or high blood pressure use plain Mucinex. Saline nasal spray help and can safely be used as often as needed for congestion, I have prescribed: Azelastine nasal spray 2 sprays in each nostril twice a day  Some authorities believe that zinc sprays or the use of Echinacea may shorten the course of your symptoms.  Sinus infections are not as easily transmitted as other respiratory infection, however we still recommend that you avoid close contact with loved ones, especially the very young and elderly.  Remember to wash your hands thoroughly throughout the day as this is the number one way to prevent the spread of infection!  Home Care: Only take medications as instructed by your medical team. Do not take these medications with alcohol. A steam or ultrasonic humidifier can help congestion.  You can place a towel over your head and breathe in the steam from hot water coming from a faucet. Avoid close contacts especially the very young and the elderly. Cover your mouth when you cough or sneeze. Always remember to wash your hands.  Get Help Right Away If: You develop worsening fever or sinus pain. You develop a severe head ache or visual changes. Your symptoms persist after you have completed your treatment plan.  Make sure you Understand these instructions. Will watch your condition. Will get help right away if you are not doing well or get  worse.  Your e-visit answers were reviewed by a board certified advanced clinical practitioner to complete your personal care plan.  Depending on the condition, your plan could have included both over the counter or prescription medications.  If there is a problem please reply  once you have received a response from your provider.  Your safety is important to us .  If you have drug allergies check your prescription carefully.    You can use MyChart to ask questions about today's visit, request a non-urgent call back, or ask for a work or school excuse for 24 hours related to this e-Visit. If it has been greater than 24 hours you will need to follow up with your provider, or enter a new e-Visit to address those concerns.  You will get an e-mail in the next two days asking about your experience.  I hope that your e-visit has been valuable and will speed your recovery. Thank you for using e-visits.  I have spent 5 minutes in review of e-visit questionnaire, review and updating patient chart, medical decision making and response to patient.   Delon CHRISTELLA Dickinson, PA-C

## 2024-03-16 NOTE — Progress Notes (Signed)
  Because you have failed a first line treatment and having recurrent symptoms, I feel your condition warrants further evaluation and I recommend that you be seen in a face-to-face visit.   NOTE: There will be NO CHARGE for this E-Visit   If you are having a true medical emergency, please call 911.     For an urgent face to face visit, [facility] has multiple urgent care centers for your convenience.  Click the link below for the full list of locations and hours, walk-in wait times, appointment scheduling options and driving directions:  Urgent Care - [place], [place], [place], [place], [place], [place]       Your MyChart E-visit questionnaire answers were reviewed by a board certified advanced clinical practitioner to complete your personal care plan based on your specific symptoms.    Thank you for using e-Visits.

## 2024-03-16 NOTE — Addendum Note (Signed)
 Addended by: VIVIENNE DELON HERO on: 03/16/2024 07:32 AM   Modules accepted: Orders, Level of Service

## 2024-04-10 ENCOUNTER — Other Ambulatory Visit (HOSPITAL_COMMUNITY): Payer: Self-pay

## 2024-04-10 MED ORDER — BUPROPION HCL ER (XL) 150 MG PO TB24
150.0000 mg | ORAL_TABLET | Freq: Every day | ORAL | 0 refills | Status: DC
  Filled 2024-04-10: qty 30, 30d supply, fill #0

## 2024-05-25 ENCOUNTER — Ambulatory Visit: Payer: Self-pay | Admitting: Family Medicine

## 2024-05-25 ENCOUNTER — Encounter: Payer: Self-pay | Admitting: Family Medicine

## 2024-05-25 ENCOUNTER — Other Ambulatory Visit

## 2024-05-25 ENCOUNTER — Other Ambulatory Visit (HOSPITAL_COMMUNITY): Payer: Self-pay

## 2024-05-25 ENCOUNTER — Other Ambulatory Visit: Payer: Self-pay

## 2024-05-25 VITALS — BP 129/85 | HR 107 | Temp 97.6°F | Ht 64.0 in | Wt 201.8 lb

## 2024-05-25 DIAGNOSIS — Z8639 Personal history of other endocrine, nutritional and metabolic disease: Secondary | ICD-10-CM | POA: Diagnosis not present

## 2024-05-25 DIAGNOSIS — F411 Generalized anxiety disorder: Secondary | ICD-10-CM | POA: Insufficient documentation

## 2024-05-25 DIAGNOSIS — F321 Major depressive disorder, single episode, moderate: Secondary | ICD-10-CM | POA: Diagnosis not present

## 2024-05-25 DIAGNOSIS — E049 Nontoxic goiter, unspecified: Secondary | ICD-10-CM | POA: Insufficient documentation

## 2024-05-25 DIAGNOSIS — Z6834 Body mass index (BMI) 34.0-34.9, adult: Secondary | ICD-10-CM | POA: Diagnosis not present

## 2024-05-25 DIAGNOSIS — R002 Palpitations: Secondary | ICD-10-CM | POA: Diagnosis not present

## 2024-05-25 DIAGNOSIS — Z8632 Personal history of gestational diabetes: Secondary | ICD-10-CM | POA: Diagnosis not present

## 2024-05-25 DIAGNOSIS — E559 Vitamin D deficiency, unspecified: Secondary | ICD-10-CM

## 2024-05-25 LAB — LIPID PANEL

## 2024-05-25 LAB — BAYER DCA HB A1C WAIVED: HB A1C (BAYER DCA - WAIVED): 4.8 % (ref 4.8–5.6)

## 2024-05-25 MED ORDER — BUPROPION HCL ER (XL) 150 MG PO TB24
150.0000 mg | ORAL_TABLET | Freq: Every day | ORAL | 0 refills | Status: DC
  Filled 2024-05-25 – 2024-05-28 (×2): qty 90, 90d supply, fill #0

## 2024-05-25 MED ORDER — BUPROPION HCL ER (XL) 150 MG PO TB24
150.0000 mg | ORAL_TABLET | Freq: Every day | ORAL | 0 refills | Status: DC
  Filled 2024-05-25: qty 90, 90d supply, fill #0

## 2024-05-25 NOTE — Progress Notes (Signed)
 Subjective:  Patient ID: Kelsey Wheeler, female    DOB: October 10, 1995, 28 y.o.   MRN: [id number]  Patient Care Team: Severa Rock HERO, FNP as PCP - General (Family Medicine)   Chief Complaint:  New Patient (Initial Visit) and Establish Care ([facility]/)   HPI: Kelsey Wheeler is a 28 y.o. female presenting on 05/25/2024 for New Patient (Initial Visit) and Establish Care ([facility]/)    Kelsey Wheeler is a 28 year old female who presents with palpitations and anxiety.  She experiences episodes of palpitations with heart rates reaching up to 143 bpm, as recorded by her Apple Watch. These episodes occur sporadically, including at work and while commuting. Her heart rate fluctuates between 110 and 130 bpm, sometimes accompanied by a sensation of awareness of her heartbeat. No shortness of breath is associated with these episodes.  She has a history of thyroiditis, for which she underwent a nuclear medicine scan and possibly received radioactive iodine  treatment. She is uncertain if her thyroid  was ablated. Her thyroid  levels were last checked in late spring and summer, and she reports no current symptoms of thyroid  dysfunction.  She has a history of gestational diabetes managed with diet control, with stable A1c levels since then.  She is currently taking Wellbutrin  150 mg daily for anxiety and depressive symptoms, which she started in late September or early October. She previously used Zoloft  postpartum for about a year.  Her family history includes breast and lung cancer on her maternal side and atrial fibrillation on her paternal side. She consumes caffeine, including coffee and energy drinks, and acknowledges that her water intake may be insufficient.          Relevant past medical, surgical, family, and social history reviewed and updated as indicated.  Allergies and medications reviewed and updated. Data reviewed: Chart  in Epic.   Past Medical History:  Diagnosis Date   ADD (attention deficit disorder) 03/03/2014   GERD (gastroesophageal reflux disease)    Gestational diabetes    Hx of thyroiditis     Past Surgical History:  Procedure Laterality Date   IUD REMOVAL  06/03/2023   Procedure: INTRAUTERINE DEVICE (IUD) REMOVAL;  Surgeon: Gorge Ade, MD;  Location: [facility];  Service: Gynecology;;   LAPAROSCOPIC BILATERAL SALPINGECTOMY Bilateral 06/03/2023   Procedure: LAPAROSCOPIC BILATERAL SALPINGECTOMY;  Surgeon: Gorge Ade, MD;  Location: [facility];  Service: Gynecology;  Laterality: Bilateral;   LAPAROSCOPIC LYSIS OF ADHESIONS  06/03/2023   Procedure: LAPAROSCOPIC LYSIS OF  SIGMOID ADHESIONS;  Surgeon: Gorge Ade, MD;  Location: [facility];  Service: Gynecology;;   WISDOM TOOTH EXTRACTION      Social History   Socioeconomic History   Marital status: Married    Spouse name: Not on file   Number of children: Not on file   Years of education: Not on file   Highest education level: Not on file  Occupational History   Not on file  Tobacco Use   Smoking status: Never   Smokeless tobacco: Never  Vaping Use   Vaping status: Never Used  Substance and Sexual Activity   Alcohol use: Not Currently   Drug use: Never   Sexual activity: Yes    Partners: Male  Other Topics Concern   Not on file  Social History Narrative   Not on file   Social Drivers of Health   Tobacco Use: Low Risk (05/25/2024)   Patient History  Smoking Tobacco Use: Never    Smokeless Tobacco Use: Never    Passive Exposure: Not on file  Financial Resource Strain: Not on file  Food Insecurity: No Food Insecurity (07/17/2021)   Hunger Vital Sign    Worried About Running Out of Food in the Last Year: Never true    Ran Out of Food in the Last Year: Never true  Transportation Needs: Not on file  Physical Activity: Not on file  Stress: Not on file  Social  Connections: Not on file  Intimate Partner Violence: Not on file  Depression (PHQ2-9): Medium Risk (05/25/2024)   Depression (PHQ2-9)    PHQ-2 Score: 5  Alcohol Screen: Not on file  Housing: Not on file  Utilities: Not on file  Health Literacy: Not on file    Outpatient Encounter Medications as of 05/25/2024  Medication Sig   buPROPion  (WELLBUTRIN  XL) 150 MG 24 hr tablet Take 1 tablet (150 mg total) by mouth daily.   [DISCONTINUED] azelastine  (ASTELIN ) 0.1 % nasal spray Place 1 spray into both nostrils 2 (two) times daily. Use in each nostril as directed   [DISCONTINUED] buPROPion  (WELLBUTRIN  XL) 150 MG 24 hr tablet Take 1 tablet (150 mg total) by mouth daily.   [DISCONTINUED] buPROPion  (WELLBUTRIN  XL) 150 MG 24 hr tablet Take 1 tablet (150 mg total) by mouth daily.   [DISCONTINUED] Cholecalciferol  (VITAMIN D ) 50 MCG (2000 UT) CAPS Take by mouth. (Patient not taking: Reported on 05/30/2023)   [DISCONTINUED] Diethylpropion  HCl 25 MG TABS Take 1 tablet (25 mg total) by mouth daily. (Patient not taking: Reported on 05/30/2023)   [DISCONTINUED] Multiple Vitamins-Minerals (HAIR SKIN AND NAILS FORMULA PO) Take by mouth.   No facility-administered encounter medications on file as of 05/25/2024.    Allergies[1]  Pertinent ROS per HPI, otherwise unremarkable      Objective:  BP 129/85   Pulse (!) 107   Temp 97.6 F (36.4 C)   Ht 5' 4 (1.626 m)   Wt 201 lb 12.8 oz (91.5 kg)   LMP 05/01/2024   SpO2 95%   BMI 34.64 kg/m    Wt Readings from Last 3 Encounters:  05/25/24 201 lb 12.8 oz (91.5 kg)  06/03/23 201 lb 3.2 oz (91.3 kg)  05/17/23 205 lb (93 kg)    Physical Exam Vitals and nursing note reviewed.  Constitutional:      General: She is not in acute distress.    Appearance: Normal appearance. She is well-developed and well-groomed. She is obese. She is not ill-appearing, toxic-appearing or diaphoretic.  HENT:     Head: Normocephalic and atraumatic.     Jaw: There is  normal jaw occlusion.     Right Ear: Hearing normal.     Left Ear: Hearing normal.     Nose: Nose normal.     Mouth/Throat:     Lips: Pink.     Mouth: Mucous membranes are moist.     Pharynx: Oropharynx is clear. Uvula midline.  Eyes:     General: Lids are normal.     Extraocular Movements: Extraocular movements intact.     Conjunctiva/sclera: Conjunctivae normal.     Pupils: Pupils are equal, round, and reactive to light.  Neck:     Thyroid : Thyromegaly present. No thyroid  mass or thyroid  tenderness.     Vascular: No JVD.     Trachea: Trachea and phonation normal.  Cardiovascular:     Rate and Rhythm: Normal rate and regular rhythm.     Chest Wall:  PMI is not displaced.     Pulses: Normal pulses.     Heart sounds: Normal heart sounds. No murmur heard.    No friction rub. No gallop.  Pulmonary:     Effort: Pulmonary effort is normal. No respiratory distress.     Breath sounds: Normal breath sounds. No wheezing.  Abdominal:     General: Bowel sounds are normal.     Palpations: Abdomen is soft.  Musculoskeletal:        General: Normal range of motion.     Cervical back: Normal range of motion and neck supple.     Right lower leg: No edema.     Left lower leg: No edema.  Lymphadenopathy:     Cervical: No cervical adenopathy.  Skin:    General: Skin is warm and dry.     Capillary Refill: Capillary refill takes less than 2 seconds.     Coloration: Skin is not cyanotic, jaundiced or pale.     Findings: No rash.  Neurological:     General: No focal deficit present.     Mental Status: She is alert and oriented to person, place, and time.     Sensory: Sensation is intact.     Motor: Motor function is intact.     Coordination: Coordination is intact.     Gait: Gait is intact.     Deep Tendon Reflexes: Reflexes are normal and symmetric.  Psychiatric:        Attention and Perception: Attention and perception normal.        Mood and Affect: Mood and affect normal.         Speech: Speech normal.        Behavior: Behavior normal. Behavior is cooperative.        Thought Content: Thought content normal.        Cognition and Memory: Cognition and memory normal.        Judgment: Judgment normal.       Results for orders placed or performed during the hospital encounter of 06/03/23  CBC   Collection Time: 06/03/23  7:34 AM  Result Value Ref Range   WBC 11.9 (H) 4.0 - 10.5 K/uL   RBC 5.12 (H) 3.87 - 5.11 MIL/uL   Hemoglobin 15.4 (H) 12.0 - 15.0 g/dL   HCT 55.1 63.9 - 53.9 %   MCV 87.5 80.0 - 100.0 fL   MCH 30.1 26.0 - 34.0 pg   MCHC 34.4 30.0 - 36.0 g/dL   RDW 87.8 88.4 - 84.4 %   Platelets 290 150 - 400 K/uL   nRBC 0.0 0.0 - 0.2 %  hCG, serum, qualitative   Collection Time: 06/03/23  7:34 AM  Result Value Ref Range   Preg, Serum NEGATIVE NEGATIVE  Type and screen   Collection Time: 06/03/23  7:34 AM  Result Value Ref Range   ABO/RH(D) O NEG    Antibody Screen NEG    Sample Expiration      06/06/2023,2359 Performed at [facility], [address], [place], [place] [address]   Surgical pathology   Collection Time: 06/03/23 10:03 AM  Result Value Ref Range   SURGICAL PATHOLOGY      SURGICAL PATHOLOGY CASE: [id number] PATIENT: Charyl Winbush Surgical Pathology Report     Clinical History: Desires sterilization (tb)     FINAL MICROSCOPIC DIAGNOSIS:  A.  RIGHT AND LEFT FALLOPIAN TUBE, BILATERAL SALPINGECTOMY: Benign fallopian tubes Small cystic benign [place] rest   GROSS DESCRIPTION:  A.  Received fresh and subsequently placed in formalin labeled with the patient's name and Bilateral fallopian tubes are two undesignated red-tan, fimbriated fallopian tubes, 6.5 x 0.3 cm and 9.6 x 0.3 cm, with unremarkable cut and serosal surfaces. The fimbria and representative cross sections are each submitted in their own cassettes (2 blocks total). (LEF 06/03/2023)    Final Diagnosis performed by Prentice Pitcher,  MD.   Electronically signed 06/06/2023 Technical component performed at [facility], [address], [place], [place] [address].  Professional component performed at [employer], [address] [place], [place] [address].  Immunohistochemistry Technical component (if applicable) was performed at [facility]. [address], [place], [place] [address].   IMMUNOHISTOCHEMISTRY DISCLAIMER (if applicable): Some of these immunohistochemical stains may have been developed and the performance characteristics determine by [facility]. Some may not have been cleared or approved by the U.S. Food and Drug Administration. The FDA has determined that such clearance or approval is not necessary. This test is used for clinical purposes. It should not be regarded as investigational or for research. This laboratory is certified under the Clinical Laboratory Improvement Amendments of 1988 (CLIA-88) as qualified to perform high complexity clinical laboratory testing.  The controls stained appropriately.   IHC stains are performed on formalin fixed, paraffin embedded tissue using a 3,3diaminobenzidine (DAB) chromogen and Leica Bond Autost ainer System. The staining intensity of the nucleus is score manually and is reported as the percentage of tumor cell nuclei demonstrating specific nuclear staining. The specimens are fixed in 10% Neutral Formalin for at least 6 hours and up to 72hrs. These tests are validated on decalcified tissue. Results should be interpreted with caution given the possibility of false negative results on decalcified specimens. Antibody Clones are as follows ER-clone 48F, PR-clone 16, Ki67- clone MM1. Some of these immunohistochemical stains may have been developed and the performance characteristics determined by [facility].        Pertinent labs & imaging results that were available during my  care of the patient were reviewed by me and considered in my medical decision making.  Assessment & Plan:  Natali was seen today for new patient (initial visit) and establish care.  Diagnoses and all orders for this visit:  Depression, major, single episode, moderate (HCC) -     Discontinue: buPROPion  (WELLBUTRIN  XL) 150 MG 24 hr tablet; Take 1 tablet (150 mg total) by mouth daily. -     CMP14+EGFR -     CBC with Differential/Platelet -     TSH -     VITAMIN D  25 Hydroxy (Vit-D Deficiency, Fractures) -     T4, Free -     buPROPion  (WELLBUTRIN  XL) 150 MG 24 hr tablet; Take 1 tablet (150 mg total) by mouth daily.  GAD (generalized anxiety disorder) -     Discontinue: buPROPion  (WELLBUTRIN  XL) 150 MG 24 hr tablet; Take 1 tablet (150 mg total) by mouth daily. -     CMP14+EGFR -     CBC with Differential/Platelet -     TSH -     VITAMIN D  25 Hydroxy (Vit-D Deficiency, Fractures) -     T4, Free -     buPROPion  (WELLBUTRIN  XL) 150 MG 24 hr tablet; Take 1 tablet (150 mg total) by mouth daily.  Vitamin D  deficiency -     CMP14+EGFR -     VITAMIN D  25 Hydroxy (Vit-D Deficiency, Fractures)  Hx  of thyroiditis -     TSH -     T4, Free -     Anti-TPO Ab (RDL) -     T3, Free  Enlarged thyroid  -     Anti-TPO Ab (RDL) -     T3, Free  Palpitations -     CMP14+EGFR -     CBC with Differential/Platelet -     Lipid panel -     TSH -     VITAMIN D  25 Hydroxy (Vit-D Deficiency, Fractures) -     T4, Free -     LONG TERM MONITOR (3-14 DAYS); Future -     T3, Free  BMI 34.0-34.9,adult -     CMP14+EGFR -     CBC with Differential/Platelet -     Lipid panel -     TSH -     VITAMIN D  25 Hydroxy (Vit-D Deficiency, Fractures) -     T4, Free -     Bayer DCA Hb A1c Waived  History of gestational diabetes -     CMP14+EGFR -     Bayer DCA Hb A1c Waived      Major depressive disorder, single episode Currently managed with Wellbutrin  150 mg, which is effective. She inquires about  increasing the dose to assess efficacy. - Prescribed Wellbutrin  150 mg, instructed to take two tablets daily and monitor response. - Sent prescription to [facility] for mailing.  Generalized anxiety disorder Anxiety symptoms are managed with Wellbutrin . She reports anxiety symptoms but does not specify severity or frequency. - Continue to monitor anxiety symptoms with increased Wellbutrin  dose.  Palpitations Reports episodes of elevated heart rate, with readings up to 143 bpm, without associated shortness of breath. Family history of atrial fibrillation. - Applied Zio monitor for continuous heart rate monitoring. - Ordered EKG to assess current cardiac status. - Checked thyroid  function tests to rule out thyroid -related causes.  History of thyroiditis Previous treatment with radioactive iodine . Unclear if Synthroid was initiated post-treatment. Thyroid  function needs reassessment. - Ordered thyroid  function tests including T4 and TSH. - Ordered thyroid  antibodies test. - Will consider thyroid  ultrasound if indicated by test results.  History of gestational diabetes Previously diet-controlled. Current A1c levels are well-managed.  General health maintenance Routine health maintenance is due, including lab work to assess overall health status. - Ordered comprehensive lab work including liver enzymes, thyroid  function, and A1c.          Continue all other maintenance medications.  Follow up plan: Return in about 6 weeks (around 07/06/2024) for palpitations, thyroid .   Continue healthy lifestyle choices, including diet (rich in fruits, vegetables, and lean proteins, and low in salt and simple carbohydrates) and exercise (at least 30 minutes of moderate physical activity daily).  Educational handout given for health maintenance   The above assessment and management plan was discussed with the patient. The patient verbalized understanding of and has agreed to the management plan.  Patient is aware to call the clinic if they develop any new symptoms or if symptoms persist or worsen. Patient is aware when to return to the clinic for a follow-up visit. Patient educated on when it is appropriate to go to the emergency department.   Rosaline Bruns, FNP-C [facility] [phone]     [1] No Known Allergies

## 2024-05-26 LAB — LIPID PANEL
Cholesterol, Total: 162 mg/dL (ref 100–199)
HDL: 47 mg/dL (ref >39)
LDL CALC COMMENT:: 3.4 ratio (ref 0.0–4.4)
LDL Chol Calc (NIH): 78 mg/dL (ref 0–99)
Triglycerides: 227 mg/dL — AB (ref 0–149)
VLDL Cholesterol Cal: 37 mg/dL (ref 5–40)

## 2024-05-26 LAB — CBC WITH DIFFERENTIAL/PLATELET
Basophils Absolute: 0.1 x10E3/uL (ref 0.0–0.2)
Basos: 1 %
EOS (ABSOLUTE): 0.1 x10E3/uL (ref 0.0–0.4)
Eos: 1 %
Hematocrit: 46 % (ref 34.0–46.6)
Hemoglobin: 15.8 g/dL (ref 11.1–15.9)
Immature Grans (Abs): 0.1 x10E3/uL (ref 0.0–0.1)
Immature Granulocytes: 1 %
Lymphocytes Absolute: 3.2 x10E3/uL — ABNORMAL HIGH (ref 0.7–3.1)
Lymphs: 22 %
MCH: 31.4 pg (ref 26.6–33.0)
MCHC: 34.3 g/dL (ref 31.5–35.7)
MCV: 92 fL (ref 79–97)
Monocytes Absolute: 0.7 x10E3/uL (ref 0.1–0.9)
Monocytes: 5 %
Neutrophils Absolute: 10.2 x10E3/uL — ABNORMAL HIGH (ref 1.4–7.0)
Neutrophils: 70 %
Platelets: 295 x10E3/uL (ref 150–450)
RBC: 5.03 x10E6/uL (ref 3.77–5.28)
RDW: 12.5 % (ref 11.7–15.4)
WBC: 14.4 x10E3/uL — ABNORMAL HIGH (ref 3.4–10.8)

## 2024-05-26 LAB — CMP14+EGFR
ALT: 44 IU/L — AB (ref 0–32)
AST: 24 IU/L (ref 0–40)
Albumin: 4.4 g/dL (ref 4.0–5.0)
Alkaline Phosphatase: 98 IU/L (ref 41–116)
BUN/Creatinine Ratio: 13 (ref 9–23)
BUN: 12 mg/dL (ref 6–20)
Bilirubin Total: <0.2 mg/dL (ref 0.0–1.2)
CO2: 25 mmol/L (ref 20–29)
Calcium: 9.8 mg/dL (ref 8.7–10.2)
Chloride: 104 mmol/L (ref 96–106)
Creatinine, Ser: 0.91 mg/dL (ref 0.57–1.00)
Globulin, Total: 2.7 g/dL (ref 1.5–4.5)
Glucose: 105 mg/dL — AB (ref 70–99)
Potassium: 4.1 mmol/L (ref 3.5–5.2)
Sodium: 144 mmol/L (ref 134–144)
Total Protein: 7.1 g/dL (ref 6.0–8.5)
eGFR: 87 mL/min/1.73 (ref >59)

## 2024-05-26 LAB — T4, FREE: Free T4: 1.23 ng/dL (ref 0.82–1.77)

## 2024-05-26 LAB — VITAMIN D 25 HYDROXY (VIT D DEFICIENCY, FRACTURES): Vit D, 25-Hydroxy: 23 ng/mL — AB (ref 30.0–100.0)

## 2024-05-26 LAB — TSH: TSH: 0.696 u[IU]/mL (ref 0.450–4.500)

## 2024-05-26 LAB — T3, FREE: T3, Free: 2.8 pg/mL (ref 2.0–4.4)

## 2024-05-27 ENCOUNTER — Ambulatory Visit: Payer: Self-pay | Admitting: Family Medicine

## 2024-05-27 DIAGNOSIS — D72829 Elevated white blood cell count, unspecified: Secondary | ICD-10-CM

## 2024-05-28 ENCOUNTER — Other Ambulatory Visit: Payer: Self-pay

## 2024-05-28 ENCOUNTER — Other Ambulatory Visit (HOSPITAL_COMMUNITY): Payer: Self-pay

## 2024-05-30 LAB — ANTI-TPO AB (RDL): Anti-TPO Ab (RDL): <9 [IU]/mL (ref <9.0)

## 2024-06-12 DIAGNOSIS — R002 Palpitations: Secondary | ICD-10-CM | POA: Diagnosis not present

## 2024-06-29 ENCOUNTER — Other Ambulatory Visit: Payer: Self-pay | Admitting: Medical Genetics

## 2024-06-29 ENCOUNTER — Other Ambulatory Visit: Payer: Self-pay | Admitting: Family

## 2024-06-29 DIAGNOSIS — D72829 Elevated white blood cell count, unspecified: Secondary | ICD-10-CM

## 2024-07-02 ENCOUNTER — Other Ambulatory Visit: Payer: Self-pay

## 2024-07-02 ENCOUNTER — Inpatient Hospital Stay

## 2024-07-02 ENCOUNTER — Other Ambulatory Visit (HOSPITAL_COMMUNITY)

## 2024-07-02 ENCOUNTER — Inpatient Hospital Stay: Attending: Family | Admitting: Family

## 2024-07-02 VITALS — BP 133/83 | HR 85 | Temp 97.3°F | Resp 19 | Wt 200.0 lb

## 2024-07-02 DIAGNOSIS — K5900 Constipation, unspecified: Secondary | ICD-10-CM | POA: Diagnosis not present

## 2024-07-02 DIAGNOSIS — Z801 Family history of malignant neoplasm of trachea, bronchus and lung: Secondary | ICD-10-CM | POA: Diagnosis not present

## 2024-07-02 DIAGNOSIS — K760 Fatty (change of) liver, not elsewhere classified: Secondary | ICD-10-CM | POA: Diagnosis not present

## 2024-07-02 DIAGNOSIS — Z9079 Acquired absence of other genital organ(s): Secondary | ICD-10-CM | POA: Diagnosis not present

## 2024-07-02 DIAGNOSIS — Z8632 Personal history of gestational diabetes: Secondary | ICD-10-CM | POA: Insufficient documentation

## 2024-07-02 DIAGNOSIS — Z803 Family history of malignant neoplasm of breast: Secondary | ICD-10-CM | POA: Diagnosis not present

## 2024-07-02 DIAGNOSIS — D72829 Elevated white blood cell count, unspecified: Secondary | ICD-10-CM | POA: Diagnosis present

## 2024-07-02 DIAGNOSIS — Z79899 Other long term (current) drug therapy: Secondary | ICD-10-CM | POA: Insufficient documentation

## 2024-07-02 LAB — CBC WITH DIFFERENTIAL/PLATELET
Abs Immature Granulocytes: 0.06 K/uL (ref 0.00–0.07)
Basophils Absolute: 0.1 K/uL (ref 0.0–0.1)
Basophils Relative: 1 %
Eosinophils Absolute: 0.2 K/uL (ref 0.0–0.5)
Eosinophils Relative: 1 %
HCT: 44.4 % (ref 36.0–46.0)
Hemoglobin: 15.4 g/dL — ABNORMAL HIGH (ref 12.0–15.0)
Immature Granulocytes: 1 %
Lymphocytes Relative: 26 %
Lymphs Abs: 3.3 K/uL (ref 0.7–4.0)
MCH: 30.4 pg (ref 26.0–34.0)
MCHC: 34.7 g/dL (ref 30.0–36.0)
MCV: 87.6 fL (ref 80.0–100.0)
Monocytes Absolute: 0.7 K/uL (ref 0.1–1.0)
Monocytes Relative: 5 %
Neutro Abs: 8.6 K/uL — ABNORMAL HIGH (ref 1.7–7.7)
Neutrophils Relative %: 66 %
Platelets: 280 K/uL (ref 150–400)
RBC: 5.07 MIL/uL (ref 3.87–5.11)
RDW: 11.9 % (ref 11.5–15.5)
WBC: 12.9 K/uL — ABNORMAL HIGH (ref 4.0–10.5)
nRBC: 0 % (ref 0.0–0.2)

## 2024-07-02 LAB — COMPREHENSIVE METABOLIC PANEL WITH GFR
ALT: 39 U/L (ref 0–44)
AST: 22 U/L (ref 15–41)
Albumin: 4.3 g/dL (ref 3.5–5.0)
Alkaline Phosphatase: 82 U/L (ref 38–126)
Anion gap: 13 (ref 5–15)
BUN: 12 mg/dL (ref 6–20)
CO2: 25 mmol/L (ref 22–32)
Calcium: 9.1 mg/dL (ref 8.9–10.3)
Chloride: 104 mmol/L (ref 98–111)
Creatinine, Ser: 0.82 mg/dL (ref 0.44–1.00)
GFR, Estimated: >60 mL/min
Glucose, Bld: 86 mg/dL (ref 70–99)
Potassium: 3.8 mmol/L (ref 3.5–5.1)
Sodium: 141 mmol/L (ref 135–145)
Total Bilirubin: 0.2 mg/dL (ref 0.0–1.2)
Total Protein: 7.1 g/dL (ref 6.5–8.1)

## 2024-07-02 LAB — LACTATE DEHYDROGENASE: LDH: 175 U/L (ref 105–235)

## 2024-07-02 LAB — SAVE SMEAR(SSMR), FOR PROVIDER SLIDE REVIEW

## 2024-07-02 NOTE — Progress Notes (Addendum)
 Hematology/Oncology Consultation   Name: Kelsey Wheeler      MRN: [id number]    Location: Room/bed info not found  Date: 07/02/2024 Time:4:01 PM   REFERRING PHYSICIAN:  Rock Bruns, FNP  REASON FOR CONSULT: Leukocytosis    DIAGNOSIS: Leukocytosis, mild  HISTORY OF PRESENT ILLNESS:  Kelsey Wheeler is present at [facility] and I am present at [facility] for our video visit today. She is a pleasant 29 yo caucasian female with at least an 11 year history of mild leukocytosis.  She denies any issue with frequent or recurrent infections.  Her spleen is still in.  She has history of hepatic steatosis. ALT in the past has been mildly elevated.  No smoking, ETOH or recreational drug use.  Her father has erythrocytosis (cause unknown to Kelsey Wheeler) and he donates blood regularly with the [employer].  She has history of gestational diabetes that resolved once she gave birth.  She has 2 children born at 20 and 39 weeks. No history of miscarriage.  Only post surgery was removal of her fallopian tubes.  No history of thyroid  disease.  No personal history of cancer. Her maternal grandmother had breast and lung.  Only adenopathy was noted under the chin and in the right axilla with sinus infection back in December. This has mostly resolved per Kelsey Wheeler.  No fever, chills, n/v, cough, rash, dizziness, SOB, chest pain, palpitations, abdominal pain or changes in bowel or bladder habits at this time. She states that she wore a Holter monitor last year for palpitations but this did not reveal any issue.   Occasional constipation with Wellbutrin .  No swelling, tenderness, numbness or tingling in her extremities.  No falls or syncope reported.  Appetite and hydration are good.  She works as an [employer] at [employer] day [employer].   ROS: All other 10 point review of systems is negative.   PAST MEDICAL HISTORY:   Past Medical History:  Diagnosis Date   ADD (attention deficit disorder) 03/03/2014   GERD (gastroesophageal reflux  disease)    Gestational diabetes    Hx of thyroiditis     ALLERGIES: Allergies[1]    MEDICATIONS:  Medications Ordered Prior to Encounter[2]   PAST SURGICAL HISTORY Past Surgical History:  Procedure Laterality Date   IUD REMOVAL  06/03/2023   Procedure: INTRAUTERINE DEVICE (IUD) REMOVAL;  Surgeon: Gorge Ade, MD;  Location: [facility];  Service: Gynecology;;   LAPAROSCOPIC BILATERAL SALPINGECTOMY Bilateral 06/03/2023   Procedure: LAPAROSCOPIC BILATERAL SALPINGECTOMY;  Surgeon: Gorge Ade, MD;  Location: [facility];  Service: Gynecology;  Laterality: Bilateral;   LAPAROSCOPIC LYSIS OF ADHESIONS  06/03/2023   Procedure: LAPAROSCOPIC LYSIS OF  SIGMOID ADHESIONS;  Surgeon: Gorge Ade, MD;  Location:  SURGERY CENTER;  Service: Gynecology;;   WISDOM TOOTH EXTRACTION      FAMILY HISTORY: Family History  Problem Relation Age of Onset   Hyperlipidemia Mother    Hyperlipidemia Father    Hyperlipidemia Maternal Grandmother    Hypertension Maternal Grandmother    Lung cancer Maternal Grandmother    Breast cancer Maternal Grandmother    Lung cancer Maternal Grandfather    Diabetes Paternal Grandmother    Atrial fibrillation Paternal Grandmother    Diabetes Paternal Grandfather    Heart disease Paternal Grandfather    Hyperlipidemia Paternal Grandfather    Heart failure Paternal Grandfather     SOCIAL HISTORY:  reports that she has never smoked. She has never used smokeless tobacco. She reports that she  does not currently use alcohol. She reports that she does not use drugs.  PERFORMANCE STATUS: The Kelsey Wheeler's performance status is 0 - Asymptomatic  PHYSICAL EXAM: Most Recent Vital Signs: Blood pressure 133/83, pulse 85, temperature (!) 97.3 F (36.3 C), temperature source Oral, resp. rate 19, weight 200 lb (90.7 kg), SpO2 99%. BP 133/83 (BP Location: Left Arm, Kelsey Wheeler Position: Sitting)   Pulse 85   Temp (!) 97.3 F (36.3  C) (Oral)   Resp 19   Wt 200 lb (90.7 kg)   SpO2 99%   BMI 34.33 kg/m   General Appearance:    Alert, cooperative, no distress, appears stated age  Head:    Normocephalic, without obvious abnormality, atraumatic  Eyes:    PERRL, conjunctiva/corneas clear, EOM's intact, fundi    benign, both eyes        Throat:   Lips, mucosa, and tongue normal; teeth and gums normal  Neck:   Supple, symmetrical, trachea midline, no adenopathy;    thyroid :  no enlargement/tenderness/nodules; no carotid   bruit or JVD  Back:     Symmetric, no curvature, ROM normal, no CVA tenderness  Lungs:     Clear to auscultation bilaterally, respirations unlabored  Chest Wall:    No tenderness or deformity   Heart:    Regular rate and rhythm, S1 and S2 normal, no murmur, rub   or gallop     Abdomen:     Soft, non-tender, bowel sounds active all four quadrants,    no masses, no organomegaly        Extremities:   Extremities normal, atraumatic, no cyanosis or edema  Pulses:   2+ and symmetric all extremities  Skin:   Skin color, texture, turgor normal, no rashes or lesions          LABORATORY DATA:  No results found for this or any previous visit (from the past 48 hours).    RADIOGRAPHY: No results found.     PATHOLOGY: None   ASSESSMENT/PLAN:  Kelsey Wheeler is a pleasant 29 yo caucasian female with at least an 11 year history of mild leukocytosis.  She remains asymptomatic at this time.  Blood smear was reviewed by pathologist at [facility] and Kelsey Wheeler was noted to have a bunch of neutrophils, some of them were hypersegmented and some immature cells but no blasts.  We will have her return for MMyeloproloferative panel as well as B 12, folate and iron  studies. We will add hemochromatosis DNA if iron  is high.  Follow-up in 3 months to monitor counts. We can move this in sooner and adjust for treatment plan if needed once the above results have been reviewed.   All questions were answered. The Kelsey Wheeler knows to call  the clinic with any problems, questions or concerns. We can certainly see the Kelsey Wheeler much sooner if necessary.  The Kelsey Wheeler was discussed with Dr. Timmy and he is in agreement with the aforementioned.   Lauraine Pepper, NP           [1] No Known Allergies [2]  Current Outpatient Medications on File Prior to Visit  Medication Sig Dispense Refill   buPROPion  (WELLBUTRIN  XL) 150 MG 24 hr tablet Take 1 tablet (150 mg total) by mouth daily. 90 tablet 0   No current facility-administered medications on file prior to visit.

## 2024-07-05 ENCOUNTER — Encounter: Payer: Self-pay | Admitting: Family

## 2024-07-05 ENCOUNTER — Other Ambulatory Visit: Payer: Self-pay | Admitting: Family

## 2024-07-05 DIAGNOSIS — D751 Secondary polycythemia: Secondary | ICD-10-CM

## 2024-07-05 DIAGNOSIS — D72829 Elevated white blood cell count, unspecified: Secondary | ICD-10-CM

## 2024-07-05 DIAGNOSIS — E538 Deficiency of other specified B group vitamins: Secondary | ICD-10-CM

## 2024-07-06 ENCOUNTER — Ambulatory Visit: Admitting: Family Medicine

## 2024-07-06 ENCOUNTER — Other Ambulatory Visit (HOSPITAL_COMMUNITY)
Admission: RE | Admit: 2024-07-06 | Discharge: 2024-07-06 | Disposition: A | Payer: Self-pay | Source: Ambulatory Visit | Attending: Medical Genetics | Admitting: Medical Genetics

## 2024-07-06 ENCOUNTER — Other Ambulatory Visit (HOSPITAL_COMMUNITY): Payer: Self-pay

## 2024-07-06 ENCOUNTER — Other Ambulatory Visit: Payer: Self-pay

## 2024-07-06 ENCOUNTER — Inpatient Hospital Stay

## 2024-07-06 ENCOUNTER — Encounter: Payer: Self-pay | Admitting: Family Medicine

## 2024-07-06 VITALS — BP 126/76 | HR 88 | Temp 97.3°F | Ht 64.0 in | Wt 199.0 lb

## 2024-07-06 DIAGNOSIS — R002 Palpitations: Secondary | ICD-10-CM | POA: Diagnosis not present

## 2024-07-06 DIAGNOSIS — D751 Secondary polycythemia: Secondary | ICD-10-CM

## 2024-07-06 DIAGNOSIS — F321 Major depressive disorder, single episode, moderate: Secondary | ICD-10-CM | POA: Diagnosis not present

## 2024-07-06 DIAGNOSIS — F411 Generalized anxiety disorder: Secondary | ICD-10-CM | POA: Diagnosis not present

## 2024-07-06 DIAGNOSIS — D72829 Elevated white blood cell count, unspecified: Secondary | ICD-10-CM

## 2024-07-06 DIAGNOSIS — E538 Deficiency of other specified B group vitamins: Secondary | ICD-10-CM

## 2024-07-06 LAB — IRON AND TIBC
Iron: 68 ug/dL (ref 28–170)
Saturation Ratios: 21 % (ref 10.4–31.8)
TIBC: 325 ug/dL (ref 250–450)
UIBC: 257 ug/dL

## 2024-07-06 LAB — FERRITIN: Ferritin: 97 ng/mL (ref 11–307)

## 2024-07-06 LAB — VITAMIN B12: Vitamin B-12: 433 pg/mL (ref 180–914)

## 2024-07-06 LAB — FOLATE: Folate: >20 ng/mL

## 2024-07-06 MED ORDER — BUPROPION HCL ER (XL) 300 MG PO TB24
300.0000 mg | ORAL_TABLET | Freq: Every day | ORAL | 3 refills | Status: AC
  Filled 2024-07-06: qty 90, 90d supply, fill #0

## 2024-07-06 MED ORDER — BUPROPION HCL ER (XL) 300 MG PO TB24: 300.0000 mg | ORAL_TABLET | Freq: Every day | ORAL | 3 refills | Status: DC

## 2024-07-06 NOTE — Progress Notes (Signed)
 "    Subjective:  Patient ID: Kelsey Wheeler, female    DOB: 29-Dec-1995, 29 y.o.   MRN: [id number]  Patient Care Team: Severa Rock HERO, FNP as PCP - General (Family Medicine)   Chief Complaint:  Medical Management of Chronic Issues   HPI: Kelsey Wheeler is a 29 y.o. female presenting on 07/06/2024 for Medical Management of Chronic Issues    Kelsey Wheeler is a 29 year old female who presents for follow-up of palpitations and medication management.  She experiences episodes of palpitations with her heart rate increasing to 120-130 bpm during short walks, such as from her car to her workplace. The heart rate returns to normal shortly after these episodes. During exercise, her heart rate reaches 160 bpm on the treadmill but decreases after the activity. She has eliminated energy drinks, which she believes has helped reduce these episodes. No current palpitations or other symptoms associated with her elevated heart rate episodes.  She is currently taking Wellbutrin  150 mg twice daily, both doses together, and is doing well on this regimen. No worsening of anxiety or depression symptoms and she is sleeping well.  She is undergoing evaluation for leukocytosis and is seeing a hematologist. She is scheduled for lab work today and a follow-up appointment on Tuesday. The hematologist is checking iron , folate, B6 levels, and conducting a genetic screening, including a hemochromatosis DNA study.  She stays hydrated and avoids stimulants, which she feels has been beneficial in managing her symptoms.           07/06/2024   11:11 AM 07/02/2024    3:09 PM 05/25/2024    2:46 PM 07/17/2021   12:58 PM 01/01/2020    9:12 AM  Depression screen PHQ 2/9  Decreased Interest 1 0 2 0 0  Down, Depressed, Hopeless 0 0 0 0 0  PHQ - 2 Score 1 0 2 0 0  Altered sleeping 1  1    Tired, decreased energy 1  1    Change in appetite 1  1    Feeling bad or failure about yourself  0  0    Trouble concentrating 0  0    Moving  slowly or fidgety/restless 0  0    Suicidal thoughts 0  0    PHQ-9 Score 4  5    Difficult doing work/chores Not difficult at all  Somewhat difficult        07/06/2024   11:11 AM 05/25/2024    2:46 PM  GAD 7 : Generalized Anxiety Score  Nervous, Anxious, on Edge 1 1   Control/stop worrying 0 0   Worry too much - different things 0 0   Trouble relaxing 0 0   Restless 0 0   Easily annoyed or irritable 1 2   Afraid - awful might happen 0 0   Total GAD 7 Score 2 3  Anxiety Difficulty Not difficult at all Somewhat difficult     Data saved with a previous flowsheet row definition      Relevant past medical, surgical, family, and social history reviewed and updated as indicated.  Allergies and medications reviewed and updated. Data reviewed: Chart in Epic.   Past Medical History:  Diagnosis Date   ADD (attention deficit disorder) 03/03/2014   GERD (gastroesophageal reflux disease)    Gestational diabetes    Hx of thyroiditis     Past Surgical History:  Procedure Laterality Date   IUD REMOVAL  06/03/2023   Procedure: INTRAUTERINE DEVICE (IUD) REMOVAL;  Surgeon: Gorge Ade, MD;  Location: [facility];  Service: Gynecology;;   LAPAROSCOPIC BILATERAL SALPINGECTOMY Bilateral 06/03/2023   Procedure: LAPAROSCOPIC BILATERAL SALPINGECTOMY;  Surgeon: Gorge Ade, MD;  Location: [facility];  Service: Gynecology;  Laterality: Bilateral;   LAPAROSCOPIC LYSIS OF ADHESIONS  06/03/2023   Procedure: LAPAROSCOPIC LYSIS OF  SIGMOID ADHESIONS;  Surgeon: Gorge Ade, MD;  Location: [facility];  Service: Gynecology;;   WISDOM TOOTH EXTRACTION      Social History   Socioeconomic History   Marital status: Married    Spouse name: Not on file   Number of children: Not on file   Years of education: Not on file   Highest education level: Not on file  Occupational History   Not on file  Tobacco Use   Smoking status: Never   Smokeless  tobacco: Never  Vaping Use   Vaping status: Never Used  Substance and Sexual Activity   Alcohol use: Not Currently   Drug use: Never   Sexual activity: Yes    Partners: Male  Other Topics Concern   Not on file  Social History Narrative   Not on file   Social Drivers of Health   Tobacco Use: Low Risk (07/06/2024)   Patient History    Smoking Tobacco Use: Never    Smokeless Tobacco Use: Never    Passive Exposure: Not on file  Financial Resource Strain: Not on file  Food Insecurity: No Food Insecurity (07/02/2024)   Epic    Worried About Programme Researcher, Broadcasting/film/video in the Last Year: Never true    Ran Out of Food in the Last Year: Never true  Transportation Needs: No Transportation Needs (07/02/2024)   Epic    Lack of Transportation (Medical): No    Lack of Transportation (Non-Medical): No  Physical Activity: Not on file  Stress: Not on file  Social Connections: Not on file  Intimate Partner Violence: Not At Risk (07/02/2024)   Epic    Fear of Current or Ex-Partner: No    Emotionally Abused: No    Physically Abused: No    Sexually Abused: No  Depression (PHQ2-9): Low Risk (07/06/2024)   Depression (PHQ2-9)    PHQ-2 Score: 4  Recent Concern: Depression (PHQ2-9) - Medium Risk (05/25/2024)   Depression (PHQ2-9)    PHQ-2 Score: 5  Alcohol Screen: Not on file  Housing: Low Risk (07/02/2024)   Epic    Unable to Pay for Housing in the Last Year: No    Number of Times Moved in the Last Year: 0    Homeless in the Last Year: No  Utilities: Not At Risk (07/02/2024)   Epic    Threatened with loss of utilities: No  Health Literacy: Not on file    Outpatient Encounter Medications as of 07/06/2024  Medication Sig   [DISCONTINUED] buPROPion  (WELLBUTRIN  XL) 150 MG 24 hr tablet Take 1 tablet (150 mg total) by mouth daily. (Patient taking differently: Take 300 mg by mouth daily.)   [DISCONTINUED] buPROPion  (WELLBUTRIN  XL) 300 MG 24 hr tablet Take 1 tablet (300 mg total) by mouth daily.    buPROPion  (WELLBUTRIN  XL) 300 MG 24 hr tablet Take 1 tablet (300 mg total) by mouth daily.   No facility-administered encounter medications on file as of 07/06/2024.    Allergies[1]  Pertinent ROS per HPI, otherwise unremarkable      Objective:  BP 126/76   Pulse 88   Temp (!) 97.3 F (36.3 C) (Temporal)  Ht 5' 4 (1.626 m)   Wt 199 lb (90.3 kg)   SpO2 96%   BMI 34.16 kg/m    Wt Readings from Last 3 Encounters:  07/06/24 199 lb (90.3 kg)  07/02/24 200 lb (90.7 kg)  05/25/24 201 lb 12.8 oz (91.5 kg)    Physical Exam Vitals and nursing note reviewed.  Constitutional:      General: She is not in acute distress.    Appearance: Normal appearance. She is obese. She is not ill-appearing, toxic-appearing or diaphoretic.  HENT:     Head: Normocephalic and atraumatic.     Nose: Nose normal.  Eyes:     Pupils: Pupils are equal, round, and reactive to light.  Cardiovascular:     Rate and Rhythm: Normal rate and regular rhythm.     Heart sounds: Normal heart sounds.  Pulmonary:     Effort: Pulmonary effort is normal.     Breath sounds: Normal breath sounds.  Musculoskeletal:     Right lower leg: No edema.     Left lower leg: No edema.  Skin:    General: Skin is warm and dry.     Capillary Refill: Capillary refill takes less than 2 seconds.  Neurological:     General: No focal deficit present.     Mental Status: She is alert and oriented to person, place, and time.  Psychiatric:        Mood and Affect: Mood normal.        Behavior: Behavior normal.        Thought Content: Thought content normal.        Judgment: Judgment normal.     Results for orders placed or performed during the hospital encounter of 09/09/21  OB RESULTS CONSOLE Varicella zoster antibody, IgG   Collection Time: 02/12/21 12:00 AM  Result Value Ref Range   Varicella Immune        Pertinent labs & imaging results that were available during my care of the patient were reviewed by me and  considered in my medical decision making.  Assessment & Plan:  Kelsey Wheeler was seen today for medical management of chronic issues.  Diagnoses and all orders for this visit:  Depression, major, single episode, moderate (HCC) -     Discontinue: buPROPion  (WELLBUTRIN  XL) 300 MG 24 hr tablet; Take 1 tablet (300 mg total) by mouth daily. -     buPROPion  (WELLBUTRIN  XL) 300 MG 24 hr tablet; Take 1 tablet (300 mg total) by mouth daily.  GAD (generalized anxiety disorder) -     Discontinue: buPROPion  (WELLBUTRIN  XL) 300 MG 24 hr tablet; Take 1 tablet (300 mg total) by mouth daily. -     buPROPion  (WELLBUTRIN  XL) 300 MG 24 hr tablet; Take 1 tablet (300 mg total) by mouth daily.  Palpitations Greatly improved.   Leukocytosis Followed by oncology        Major depressive disorder, single episode, moderate Currently managed with Wellbutrin  150 mg twice daily. Reports doing well on this regimen with no adverse effects. Wellbutrin  300 mg is considered the maximum dose, though some patients may go up to 450 mg off-label. - Continue Wellbutrin  150 mg twice daily - Sent prescription for Wellbutrin  300 mg to pharmacy  Palpitations Intermittent palpitations with episodes of tachycardia during exertion. Heart monitor showed a few episodes of tachycardia but overall results were good. Thyroid  function tests were normal. No associated symptoms reported. Improvement noted after discontinuing energy drinks. - Advised to stay hydrated and avoid  stimulants - Encouraged reconditioning through regular exercise - Instructed to report if palpitations persist or worsen after reconditioning - Will consider referral to cardiologist if palpitations persist after reconditioning          Continue all other maintenance medications.  Follow up plan: Return in about 6 months (around 01/03/2025), or if symptoms worsen or fail to improve, for Annual Physical.   Continue healthy lifestyle choices, including diet (rich  in fruits, vegetables, and lean proteins, and low in salt and simple carbohydrates) and exercise (at least 30 minutes of moderate physical activity daily).  Educational handout given for palpitations  The above assessment and management plan was discussed with the patient. The patient verbalized understanding of and has agreed to the management plan. Patient is aware to call the clinic if they develop any new symptoms or if symptoms persist or worsen. Patient is aware when to return to the clinic for a follow-up visit. Patient educated on when it is appropriate to go to the emergency department.   Kelsey Bruns, FNP-C [facility] [phone]     [1] No Known Allergies  "

## 2024-07-10 ENCOUNTER — Other Ambulatory Visit (HOSPITAL_COMMUNITY)

## 2024-07-13 LAB — MPN/CML FISH

## 2024-07-18 ENCOUNTER — Encounter: Payer: Self-pay | Admitting: Family

## 2024-07-18 ENCOUNTER — Telehealth: Payer: Self-pay | Admitting: Family

## 2024-07-18 LAB — GENECONNECT MOLECULAR SCREEN: Genetic Analysis Overall Interpretation: NEGATIVE

## 2024-07-18 NOTE — Telephone Encounter (Signed)
 Left message with call back number to address any questions about her recent labs. Also let her know I would be sending a message to MyChart.

## 2024-11-09 ENCOUNTER — Inpatient Hospital Stay

## 2024-11-16 ENCOUNTER — Inpatient Hospital Stay: Admitting: Oncology

## 2025-01-08 ENCOUNTER — Encounter: Admitting: Family Medicine
